# Patient Record
Sex: Female | Born: 1995 | Race: Black or African American | Hispanic: No | Marital: Single | Smoking: Never smoker
Health system: Southern US, Community
[De-identification: ages and names within clinical notes are randomized; demographics above are authoritative.]

## PROBLEM LIST (undated history)

## (undated) DIAGNOSIS — I456 Pre-excitation syndrome: Secondary | ICD-10-CM

## (undated) DIAGNOSIS — F419 Anxiety disorder, unspecified: Secondary | ICD-10-CM

---
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
## 2014-07-19 ENCOUNTER — Emergency Department (HOSPITAL_COMMUNITY)
Admission: EM | Admit: 2014-07-19 | Discharge: 2014-07-19 | Disposition: A | Discharge status: Home / Self Care | Payer: Medicaid Other | Attending: Emergency Medicine | Admitting: Emergency Medicine

## 2014-07-19 ENCOUNTER — Encounter (HOSPITAL_COMMUNITY): Payer: Self-pay | Admitting: Emergency Medicine

## 2014-07-19 DIAGNOSIS — IMO0001 Reserved for inherently not codable concepts without codable children: Secondary | ICD-10-CM | POA: Insufficient documentation

## 2014-07-19 DIAGNOSIS — J45909 Unspecified asthma, uncomplicated: Secondary | ICD-10-CM | POA: Diagnosis not present

## 2014-07-19 DIAGNOSIS — R42 Dizziness and giddiness: Secondary | ICD-10-CM | POA: Insufficient documentation

## 2014-07-19 DIAGNOSIS — R55 Syncope and collapse: Secondary | ICD-10-CM | POA: Insufficient documentation

## 2014-07-19 DIAGNOSIS — R404 Transient alteration of awareness: Secondary | ICD-10-CM | POA: Diagnosis present

## 2014-07-19 LAB — CBC WITH DIFFERENTIAL/PLATELET
Basophils Absolute: 0.1 10*3/uL (ref 0.0–0.1)
Basophils Relative: 1 % (ref 0–1)
Eosinophils Absolute: 0.1 10*3/uL (ref 0.0–0.7)
Eosinophils Relative: 2 % (ref 0–5)
HCT: 35.3 % — ABNORMAL LOW (ref 36.0–46.0)
Hemoglobin: 11.7 g/dL — ABNORMAL LOW (ref 12.0–15.0)
Lymphocytes Relative: 39 % (ref 12–46)
Lymphs Abs: 1.9 10*3/uL (ref 0.7–4.0)
MCH: 28.2 pg (ref 26.0–34.0)
MCHC: 33.1 g/dL (ref 30.0–36.0)
MCV: 85.1 fL (ref 78.0–100.0)
Monocytes Absolute: 0.5 10*3/uL (ref 0.1–1.0)
Monocytes Relative: 9 % (ref 3–12)
Neutro Abs: 2.4 10*3/uL (ref 1.7–7.7)
Neutrophils Relative %: 49 % (ref 43–77)
Platelets: 307 10*3/uL (ref 150–400)
RBC: 4.15 MIL/uL (ref 3.87–5.11)
RDW: 14 % (ref 11.5–15.5)
WBC: 4.9 10*3/uL (ref 4.0–10.5)

## 2014-07-19 LAB — BASIC METABOLIC PANEL
ANION GAP: 16 — AB (ref 5–15)
BUN: 6 mg/dL (ref 6–23)
CALCIUM: 8.9 mg/dL (ref 8.4–10.5)
CO2: 19 mEq/L (ref 19–32)
Chloride: 106 mEq/L (ref 96–112)
Creatinine, Ser: 0.92 mg/dL (ref 0.50–1.10)
GFR calc Af Amer: >90 mL/min (ref >90)
GFR, EST NON AFRICAN AMERICAN: 90 mL/min — AB (ref >90)
Glucose, Bld: 88 mg/dL (ref 70–99)
POTASSIUM: 6.2 meq/L — AB (ref 3.7–5.3)
SODIUM: 141 meq/L (ref 137–147)

## 2014-07-19 LAB — RAPID URINE DRUG SCREEN, HOSP PERFORMED
Amphetamines: NOT DETECTED
Barbiturates: NOT DETECTED
Benzodiazepines: NOT DETECTED
Cocaine: POSITIVE — AB
Opiates: NOT DETECTED
Tetrahydrocannabinol: NOT DETECTED

## 2014-07-19 LAB — I-STAT CHEM 8, ED
BUN: 4 mg/dL — ABNORMAL LOW (ref 6–23)
Calcium, Ion: 1.2 mmol/L (ref 1.12–1.23)
Chloride: 106 mEq/L (ref 96–112)
Creatinine, Ser: 1.1 mg/dL (ref 0.50–1.10)
Glucose, Bld: 94 mg/dL (ref 70–99)
HCT: 40 % (ref 36.0–46.0)
HEMOGLOBIN: 13.6 g/dL (ref 12.0–15.0)
Potassium: 3.5 mEq/L — ABNORMAL LOW (ref 3.7–5.3)
SODIUM: 145 meq/L (ref 137–147)
TCO2: 20 mmol/L (ref 0–100)

## 2014-07-19 LAB — ETHANOL: Alcohol, Ethyl (B): 167 mg/dL — ABNORMAL HIGH (ref 0–11)

## 2014-07-19 NOTE — ED Provider Notes (Signed)
CSN: [id number]     Arrival date & time 07/19/14  1756 History   First MD Initiated Contact with Patient 07/19/14 1802     Chief Complaint  Patient presents with  . Loss of Consciousness     (Consider location/radiation/quality/duration/timing/severity/associated sxs/prior Treatment) HPI Melanie Campbell is a 18 y.o. female who is currently on probation who is here for evaluation of "fainting". She reports a couple hours ago she was riding in the car to her family's house, she started to walk up the stairs began to feel "funny and light headed" at that point she is told she passed out by her family. Her family is not here to give a detailed account of the event. She is unsure of how long she was unconscious. She denies feeling confused and no headache now. She denies loss of bowel or bladder function, no trauma to her tongue. She does report that her whole body hurts she characterizes it as a stabbing pain. She denies any history of seizures, denies any pre-existing cardiac disease, she denies any illicit substance use. No fevers, shortness of breath, chest pain, dizziness, weakness, rashes. Past Medical History  Diagnosis Date  . Asthma   . Anxiety    History reviewed. No pertinent past surgical history. No family history on file. History  Substance Use Topics  . Smoking status: Never Smoker   . Smokeless tobacco: Not on file  . Alcohol Use: Yes   OB History   Grav Para Term Preterm Abortions TAB SAB Ect Mult Living                 Review of Systems  Constitutional: Positive for fever.  Respiratory: Negative for shortness of breath.   Cardiovascular: Negative for chest pain.  Endocrine: Negative for polyuria.  Genitourinary: Negative for dysuria.  Musculoskeletal: Positive for myalgias.  Neurological: Positive for light-headedness. Negative for weakness and headaches.  All other systems reviewed and are negative.     Allergies  Review of patient's allergies indicates no  known allergies.  Home Medications   Prior to Admission medications   Not on File   Pulse 77  Temp(Src) 98.2 F (36.8 C) (Oral)  Resp 18  SpO2 100%  LMP 06/18/2014 Physical Exam  Nursing note and vitals reviewed. Constitutional:  Awake, alert, nontoxic appearance with baseline speech for patient.  HENT:  Head: Normocephalic and atraumatic.  Mouth/Throat: Oropharynx is clear and moist. No oropharyngeal exudate.  Eyes: Conjunctivae and EOM are normal. Pupils are equal, round, and reactive to light. Right eye exhibits no discharge. Left eye exhibits no discharge.  Neck: Neck supple.  Cardiovascular: Normal rate and regular rhythm.   No murmur heard. Pulmonary/Chest: Effort normal and breath sounds normal. No stridor. No respiratory distress. She has no wheezes. She has no rales. She exhibits no tenderness.  Abdominal: Soft. Bowel sounds are normal. She exhibits no mass. There is no tenderness. There is no rebound.  Musculoskeletal: She exhibits no tenderness.  Baseline ROM, moves extremities with no obvious new focal weakness.  Lymphadenopathy:    She has no cervical adenopathy.  Neurological:  Awake, alert, cooperative and aware of situation; motor strength bilaterally; sensation normal to light touch bilaterally; peripheral visual fields full to confrontation; no facial asymmetry; tongue midline; major cranial nerves appear intact; no pronator drift, normal finger to nose bilaterally, baseline gait without new ataxia.  Skin: No rash noted.  Psychiatric: She has a normal mood and affect.    ED Course  Procedures (including  critical care time) Labs Review Labs Reviewed  URINE RAPID DRUG SCREEN (HOSP PERFORMED) - Abnormal; Notable for the following:    Cocaine POSITIVE (*)    All other components within normal limits  ETHANOL - Abnormal; Notable for the following:    Alcohol, Ethyl (B) 167 (*)    All other components within normal limits  CBC WITH DIFFERENTIAL - Abnormal;  Notable for the following:    Hemoglobin 11.7 (*)    HCT 35.3 (*)    All other components within normal limits  BASIC METABOLIC PANEL - Abnormal; Notable for the following:    Potassium 6.2 (*)    GFR calc non Af Amer 90 (*)    Anion gap 16 (*)    All other components within normal limits  I-STAT CHEM 8, ED - Abnormal; Notable for the following:    Potassium 3.5 (*)    BUN 4 (*)    All other components within normal limits    Imaging Review No results found.   EKG Interpretation   Date/Time:  Friday July 19 2014 18:08:36 EDT Ventricular Rate:  88 PR Interval:  161 QRS Duration: 83 QT Interval:  356 QTC Calculation: 431 R Axis:   82 Text Interpretation:  Normal sinus rhythm No significant change since last  tracing Confirmed by STEINL  MD, Caryn Bee ([id number]) on 07/19/2014 6:16:07 PM     Meds given in ED:  Medications - No data to display  New Prescriptions   No medications on file   Filed Vitals:   07/19/14 1757  Pulse: 77  Temp: 98.2 F (36.8 C)  TempSrc: Oral  Resp: 18  SpO2: 100%   Per nurse report, pt is "actively making out with partner on ED bed and does not appear to be in any distress" MDM  Vitals stable - WNL -afebrile Pt resting comfortably in ED.  PE unremarkable for any obvious pathology Labwork shows no sign of infection or emergent anemia UDS (+) cocaine.  Pt denies having taken it "someone must've put it in my drink"  EKG shows no evidence of arrythmia. Syncope likely vasovagal as evidenced by labwork and clinical picture.No evidence of seizure. No post seizure sequelae.  Discussed f/u with PCP and return precautions, pt very amenable to plan.  Final diagnoses:  Syncope, non cardiac  Prior to patient discharge, I discussed and reviewed this case with Dr.Steinl         Sharlene Motts, PA-C 07/19/14 2132

## 2014-07-19 NOTE — ED Notes (Addendum)
Pt reports it is Thursday; able to state month/year, self, place, and situation. Pt denies CP or SOB. Pt reports drank 4 shots of vodka.

## 2014-07-19 NOTE — Discharge Instructions (Signed)
Follow up with Primary care Provider as needed. Maintain appropriate hydration Avoid illicit substance use as this can lead to fainting events. If you begin to experience fevers, shortness of breath, chest pain, numbness or weakness or you pass out again, return to ED for further evaluation.   Emergency Department Resource Guide 1) Find a Doctor and Pay Out of Pocket Although you won't have to find out who is covered by your insurance plan, it is a good idea to ask around and get recommendations. You will then need to call the office and see if the doctor you have chosen will accept you as a new patient and what types of options they offer for patients who are self-pay. Some doctors offer discounts or will set up payment plans for their patients who do not have insurance, but you will need to ask so you aren't surprised when you get to your appointment.  2) Contact Your Local Health Department Not all health departments have doctors that can see patients for sick visits, but many do, so it is worth a call to see if yours does. If you don't know where your local health department is, you can check in your phone book. The CDC also has a tool to help you locate your state's health department, and many state websites also have listings of all of their local health departments.  3) Find a Walk-in Clinic If your illness is not likely to be very severe or complicated, you may want to try a walk in clinic. These are popping up all over the country in pharmacies, drugstores, and shopping centers. They're usually staffed by nurse practitioners or physician assistants that have been trained to treat common illnesses and complaints. They're usually fairly quick and inexpensive. However, if you have serious medical issues or chronic medical problems, these are probably not your best option.  No Primary Care Doctor: - Call Health Connect at  [phone] - they can help you locate a primary care doctor that  accepts  your insurance, provides certain services, etc. - Physician Referral Service- [phone]  Chronic Pain Problems: Organization         Address  Phone   Notes  [facility]  [phone] Patients need to be referred by their primary care doctor.   Medication Assistance: Organization         Address  Phone   Notes  [facility] Medication [facility] [address] [place] [place], [place] [address] [phone] --Must be a resident of [facility] -- Must have NO insurance coverage whatsoever (no Medicaid/ Medicare, etc.) -- The pt. MUST have a primary care doctor that directs their care regularly and follows them in the community   [facility]ist  [phone]   [employer]  [phone]    Agencies that provide inexpensive medical care: Organization         Address  Phone   Notes  [facility]  [phone]   [facility]    [phone]   [facility] [address] [place], [place] [address] [phone]   [facility] [address] [place] [place] [phone]   Planned Parenthood    [phone]   [facility]    [phone]   [facility]  [address], [place] Phone:  [phone], Fax:  [phone] Hours of Operation:  9 am - 6  pm, M-F.  Also accepts Medicaid/Medicare and self-pay.  [facility] for [place] [place], [address], [place] Phone: [phone], Fax: [phone]. Hours of Operation:  8:30 am - 5:30 pm, M-F.  Also accepts Medicaid and self-pay.  [facility] [address] [place] Phone: [phone]   [place], [place], [place] [phone], Ext. [phone] Mondays & Thursdays: 7-9 AM.  First 15 patients are seen on a first come, first serve basis.    [facility] Providers:  Organization          Address  Phone   Notes  [facility] [address], [place] [phone] Also accepts self-pay patients.  [facility] [address], [place]  [phone]   [place], [address], [place] [phone]   [facility] [address] [place] [phone]   Lucianne Lei [address], [place]   [phone] Only accepts [place] Access [place] patients after they have their name applied to their card.   Self-Pay (no insurance) in [facility]:  Organization         Address  Phone   Notes  Sickle Cell Patients, [facility] [place] [phone]   [facility] [place] [phone]   [facility] [place]  [place], [place], [place] [phone]   [facility]/Dr. Osei-Bonsu  [address] [place] or [address], [place] [phone] Phone number for both [place] and [place] locations is the same.  Urgent Medical and [facility] [address], [place] [phone]   [facility] [address] [place] or [address] [phone]   [facility] [address] [place] [phone], phone; [phone], fax Sees patients 1st and 3rd Saturday of every month.  Must not qualify for public or private insurance (i.e. Medicaid, Medicare, [place] Health Choice, Veterans' Benefits)  Household income should be no more than 200% of the poverty level The clinic cannot treat you if you are pregnant or think you are pregnant  Sexually transmitted diseases are not treated at the clinic.    Dental Care: Organization         Address  Phone  Notes  [facility] [place] [phone] Accepts children up to age 78 who are enrolled in [place] or [place]; pregnant women with a Medicaid card; and children who have applied for Medicaid or [place] Health Choice, but were declined, whose parents can pay a reduced fee at time of service.  [facility] Department of [facility]  [address], [place] [phone] Accepts children up to age 76 who are enrolled in [place] or [place]; pregnant women with a Medicaid card; and children who have applied for Medicaid or  Health Choice, but were declined, whose parents can pay a reduced fee at time of service.  [place] Adult Dental Access PROGRAM  [place] [phone] Patients are seen by appointment only. Walk-ins are not accepted. [place] will see patients 3 years of age and older.  Monday - Tuesday (8am-5pm) Most Wednesdays (8:30-5pm) $30 per visit, cash only  [facility] Adult Dental Access PROGRAM  [address], [facility] [phone] Patients are seen by appointment only. Walk-ins are not accepted. [place] will see patients 66 years of age and older. One Wednesday Evening (Monthly: Volunteer Based).  $30 per visit, cash only  [place]  [phone] for adults; Children under age 42, call Graduate Pediatric Dentistry at [phone]. Children aged 27-14, please call [phone] to request a pediatric application.  Dental services are provided in all areas of dental care including fillings, crowns and bridges, complete and partial dentures, implants, gum treatment, root canals, and extractions. Preventive care is also provided. Treatment is provided to both adults and children. Patients are selected via a lottery and there is often a waiting list.   [facility] [address], [place]  [phone] [email or web address]   [facility] [address] [place], [place]  [phone], Ext. [phone] Second and Fourth Thursday of each month, opens at 6:30 AM; Clinic ends at 9 AM.  Patients are seen on a first-come first-served basis, and a limited number are seen during each clinic.   [facility]  [address] Hillard Danker [place], [place] [phone]   Eligibility Requirements You must have lived in [place], [place], or [place] counties for at least the last three months.   You cannot be eligible for state or federal sponsored [employer], including [employer], [place], or [employer].   You generally cannot be eligible for healthcare insurance through your employer.    How to apply: Eligibility screenings are held every Tuesday and Wednesday afternoon from 1:00 pm until 4:00 pm. You do not need an appointment for the interview!  [facility] [address], [place], [place]   [place]  [place] Department  [place]  [phone]    Behavioral Health Resources in the Community: Intensive Outpatient Programs Organization         Address  Phone  Notes  [place]. [address], [place], [place] [phone]   [facility] Outpatient [address], [place], [place]   ADS: Alcohol & Drug Svcs [address], [place], [place]   [place] [address]  [place], [place] or [phone]   Substance Abuse Resources Organization         Address  Phone  Notes  Alcohol and Drug Services  [phone]   [place]  [phone]   [facility] [place]   Chinita Pester  [phone]   Residential & Outpatient Substance Abuse Program  [phone]   Psychological Services Organization         Address  Phone  Notes  [facility]  [place]  [phone]    [place] [address] [place] or [phone]    Mobile Crisis Teams Organization         Address  Phone  Notes  [facility], Mobile Crisis Care Unit  [phone]   [facility]otherapeutic Services  [address] [place], [place]   [place] [address] [place] [phone]    Self-Help/Support Groups Organization         Address  Phone  Notes  [facility] [place] - variety of support groups  [place] Call for more information  Narcotics Anonymous (NA), [facility] [address], [employer] [place]  2 meetings at this location   Special educational needs teacher         Address  Phone  Notes  ASAP Residential Treatment [place],    [place]  [phone]   [facility]  [address], [place] [phone], [place], [place]   [place], [place] [phone] Admissions: 8am-3pm M-F  Incentives Substance [place] [address],    [place], [place] [phone]   [facility] [address] [place], [place], [place]   [facility] [address]  [place], [place]   [facility] Programs - Intensive Outpatient [address], [place], [place]   [facility] ([place] [place].,  [place], [place] [phone] or [phone]   [facility] Services (RTS) [address] [place], [place] Accepts Medicaid  [facility] [address],  [place] [phone] Substance Abuse/Addiction Treatment   [facility] Organization         Address  Phone  Notes  [facility]  [phone]   Domenic Schwab, PhD [address] Arlis Porta [place], [place]   [phone] or [phone]   [place]  [place], [place] [phone]   [facility] [address], [place], [place] [phone] Insurance/Medicaid/sponsorship through [facility] and Families [address], [address] [place]                                    [place], [place] [phone] [place] [address], [place] [phone]    Dr. Adele Schilder  [phone]   [facility] Dept. 1) [address] [place] 2) [place] 3)  [place] [address], [place] [phone]  [phone]   [place] [phone] or [phone] (After Hours)

## 2014-07-19 NOTE — ED Notes (Addendum)
Per EMS pt ETOH with friends. Pt felt heart racing and walked inside. On way inside LOC. Pt complaint of generalized pain but denies head injury.

## 2014-07-19 NOTE — ED Notes (Signed)
Pt is alert and oriented,  Upon entering room pt and her girlfriend are in bed together,  Kissing and hugging, this writer advised pt that "making out " is not allowed in hospital bed and the "friend" needs to sit in chair beside bed,  Pt is in NAD  .  Sharlet Salina NP aware

## 2014-07-20 NOTE — ED Provider Notes (Signed)
Medical screening examination/treatment/procedure(s) were conducted as a shared visit with non-physician practitioner(s) and myself.  I personally evaluated the patient during the encounter.   EKG Interpretation   Date/Time:  Friday July 19 2014 18:08:36 EDT Ventricular Rate:  88 PR Interval:  161 QRS Duration: 83 QT Interval:  356 QTC Calculation: 431 R Axis:   82 Text Interpretation:  Normal sinus rhythm No significant change since last  tracing Confirmed by Dan Dissinger  MD, Caryn Bee ([id number]) on 07/19/2014 6:16:07 PM      Pt indicates she felt 'funny', and achy all over after drinking alcohol and using cocaine. Denies daily use. No chest pain. No sob. No headache. No numbness/weakness. Currently feels improved. No focal pain or tenderness on exam.   Suzi Roots, MD 07/20/14 [phone]

## 2014-10-15 ENCOUNTER — Emergency Department (HOSPITAL_COMMUNITY)
Admission: EM | Admit: 2014-10-15 | Discharge: 2014-10-15 | Disposition: A | Discharge status: Home / Self Care | Payer: Medicaid Other | Attending: Emergency Medicine | Admitting: Emergency Medicine

## 2014-10-15 ENCOUNTER — Encounter (HOSPITAL_COMMUNITY): Payer: Self-pay | Admitting: Emergency Medicine

## 2014-10-15 DIAGNOSIS — R062 Wheezing: Secondary | ICD-10-CM

## 2014-10-15 DIAGNOSIS — J45901 Unspecified asthma with (acute) exacerbation: Secondary | ICD-10-CM | POA: Diagnosis not present

## 2014-10-15 DIAGNOSIS — Z8659 Personal history of other mental and behavioral disorders: Secondary | ICD-10-CM | POA: Insufficient documentation

## 2014-10-15 MED ORDER — PREDNISONE 20 MG PO TABS: 40.0000 mg | ORAL_TABLET | Freq: Every day | ORAL | Status: DC

## 2014-10-15 MED ORDER — ALBUTEROL SULFATE HFA 108 (90 BASE) MCG/ACT IN AERS: 1.0000 | INHALATION_SPRAY | Freq: Four times a day (QID) | RESPIRATORY_TRACT | Status: DC | PRN

## 2014-10-15 MED ORDER — ALBUTEROL SULFATE (2.5 MG/3ML) 0.083% IN NEBU: 2.5000 mg | INHALATION_SOLUTION | Freq: Once | RESPIRATORY_TRACT | Status: DC

## 2014-10-15 MED ORDER — ALBUTEROL SULFATE (2.5 MG/3ML) 0.083% IN NEBU
5.0000 mg | INHALATION_SOLUTION | Freq: Once | RESPIRATORY_TRACT | Status: AC
  Administered 2014-10-15: 5 mg via RESPIRATORY_TRACT
  Filled 2014-10-15: qty 6

## 2014-10-15 MED ORDER — ALBUTEROL SULFATE HFA 108 (90 BASE) MCG/ACT IN AERS
2.0000 | INHALATION_SPRAY | Freq: Once | RESPIRATORY_TRACT | Status: AC
  Administered 2014-10-15: 2 via RESPIRATORY_TRACT
  Filled 2014-10-15: qty 6.7

## 2014-10-15 MED ORDER — PREDNISONE 20 MG PO TABS
60.0000 mg | ORAL_TABLET | Freq: Once | ORAL | Status: AC
  Administered 2014-10-15: 60 mg via ORAL
  Filled 2014-10-15: qty 3

## 2014-10-15 NOTE — ED Provider Notes (Signed)
CSN: [id number]     Arrival date & time 10/15/14  1334 History  This chart was scribed for non-physician practitioner Emilia BeckKaitlyn Glenola Wheat, PA-C, working with Linwood DibblesJon Knapp, MD by Littie Deedsichard Sun, ED Scribe. This patient was seen in room TR05C/TR05C and the patient's care was started at 2:49 PM.    Chief Complaint  Patient presents with  . Asthma    Patient is a 18 y.o. female presenting with asthma. The history is provided by the patient. No language interpreter was used.  Asthma This is a chronic problem. The current episode started more than 2 days ago. The problem occurs constantly. The problem has not changed since onset.Nothing aggravates the symptoms. She has tried nothing for the symptoms.   HPI Comments: Melanie Campbell is a 18 y.o. female with a hx of asthma who presents to the Emergency Department complaining of exacerbation of her asthma including wheezing that began 1 week ago. Patient states that she ran out of her inhaler last week. She denies fever.   Past Medical History  Diagnosis Date  . Asthma   . Anxiety    History reviewed. No pertinent past surgical history. No family history on file. History  Substance Use Topics  . Smoking status: Never Smoker   . Smokeless tobacco: Not on file  . Alcohol Use: Yes   OB History    No data available     Review of Systems  Constitutional: Negative for fever.  Respiratory: Positive for wheezing.   All other systems reviewed and are negative.     Allergies  Review of patient's allergies indicates no known allergies.  Home Medications   Prior to Admission medications   Not on File   BP 114/64 mmHg  Pulse 82  Temp(Src) 98.1 F (36.7 C) (Oral)  Resp 18  Ht 5\' 2"  (1.575 m)  Wt 150 lb (68.04 kg)  BMI 27.43 kg/m2  SpO2 98%  LMP 09/23/2014 Physical Exam  Constitutional: She is oriented to person, place, and time. She appears well-developed and well-nourished. No distress.  HENT:  Head: Normocephalic and atraumatic.   Mouth/Throat: Oropharynx is clear and moist. No oropharyngeal exudate.  Eyes: Pupils are equal, round, and reactive to light.  Neck: Neck supple.  Cardiovascular: Normal rate.   Pulmonary/Chest: Effort normal. No respiratory distress. She has wheezes. She has no rales.  Wheezing noted throughout bilateral lungs.   Abdominal: Soft. She exhibits no distension.  Musculoskeletal: She exhibits no edema.  Neurological: She is alert and oriented to person, place, and time. No cranial nerve deficit.  Skin: Skin is warm and dry. No rash noted.  Psychiatric: She has a normal mood and affect. Her behavior is normal.  Nursing note and vitals reviewed.   ED Course  Procedures  DIAGNOSTIC STUDIES: Oxygen Saturation is 98% on room air, normal by my interpretation.    COORDINATION OF CARE: 2:51 PM-Discussed treatment plan which includes breathing treatment, inhaler and imaging with pt at bedside and pt agreed to plan.    Labs Review Labs Reviewed - No data to display  Imaging Review No results found.   EKG Interpretation None      MDM   Final diagnoses:  Asthma exacerbation    Patient's wheezing improved with nebulizer treatment. Vitals stable and patient afebrile. Patient will have prednisone and albuterol inhaler. Patient instructed to return with worsening or concerning symptoms.   I personally performed the services described in this documentation, which was scribed in my presence. The recorded information has  been reviewed and is accurate.    Emilia BeckKaitlyn Siddhi Dornbush, PA-C 10/16/14 1009  Linwood DibblesJon Knapp, MD 10/16/14 [phone]

## 2014-10-15 NOTE — ED Notes (Signed)
Patient states she started having "asthma problems" last week.  Patient states she ran out of her inhaler last week.  Patient called ambulance last week, but refused to be transported because "I didn't have a way back home".   Patient talking in full sentences in triage.   No distress noted.

## 2014-10-15 NOTE — Discharge Instructions (Signed)
Take prednisone as directed until gone. Use albuterol as needed. Refer to attached documents for more information.

## 2014-12-06 ENCOUNTER — Emergency Department (HOSPITAL_COMMUNITY)
Admission: EM | Admit: 2014-12-06 | Discharge: 2014-12-06 | Disposition: A | Discharge status: Home / Self Care | Payer: Medicaid Other | Attending: Emergency Medicine | Admitting: Emergency Medicine

## 2014-12-06 ENCOUNTER — Encounter (HOSPITAL_COMMUNITY): Payer: Self-pay | Admitting: Physical Medicine and Rehabilitation

## 2014-12-06 DIAGNOSIS — J45909 Unspecified asthma, uncomplicated: Secondary | ICD-10-CM | POA: Diagnosis present

## 2014-12-06 DIAGNOSIS — J45901 Unspecified asthma with (acute) exacerbation: Secondary | ICD-10-CM

## 2014-12-06 DIAGNOSIS — R319 Hematuria, unspecified: Secondary | ICD-10-CM | POA: Insufficient documentation

## 2014-12-06 DIAGNOSIS — Z7952 Long term (current) use of systemic steroids: Secondary | ICD-10-CM | POA: Insufficient documentation

## 2014-12-06 DIAGNOSIS — Z8659 Personal history of other mental and behavioral disorders: Secondary | ICD-10-CM | POA: Diagnosis not present

## 2014-12-06 DIAGNOSIS — Z3202 Encounter for pregnancy test, result negative: Secondary | ICD-10-CM | POA: Insufficient documentation

## 2014-12-06 DIAGNOSIS — Z79899 Other long term (current) drug therapy: Secondary | ICD-10-CM | POA: Insufficient documentation

## 2014-12-06 DIAGNOSIS — R300 Dysuria: Secondary | ICD-10-CM | POA: Diagnosis not present

## 2014-12-06 LAB — URINALYSIS, ROUTINE W REFLEX MICROSCOPIC
Bilirubin Urine: NEGATIVE
GLUCOSE, UA: NEGATIVE mg/dL
Hgb urine dipstick: NEGATIVE
Ketones, ur: NEGATIVE mg/dL
LEUKOCYTES UA: NEGATIVE
Nitrite: NEGATIVE
Protein, ur: NEGATIVE mg/dL
Specific Gravity, Urine: 1.016 (ref 1.005–1.030)
UROBILINOGEN UA: 0.2 mg/dL (ref 0.0–1.0)
pH: 6.5 (ref 5.0–8.0)

## 2014-12-06 LAB — PREGNANCY, URINE: Preg Test, Ur: NEGATIVE

## 2014-12-06 MED ORDER — ALBUTEROL SULFATE HFA 108 (90 BASE) MCG/ACT IN AERS: 2.0000 | INHALATION_SPRAY | RESPIRATORY_TRACT | Status: DC | PRN

## 2014-12-06 MED ORDER — ALBUTEROL SULFATE HFA 108 (90 BASE) MCG/ACT IN AERS
2.0000 | INHALATION_SPRAY | RESPIRATORY_TRACT | Status: DC | PRN
  Administered 2014-12-06: 2 via RESPIRATORY_TRACT
  Filled 2014-12-06: qty 6.7

## 2014-12-06 NOTE — ED Notes (Signed)
Pt presents to department for evaluation of asthma exacerbation, states she ran out of inhaler at home, now reports difficulty catching breath. Also states hematuria and dysuria x2 days. Pt is alert and oriented x4. Respirations unlabored. Speaking complete sentences.

## 2014-12-06 NOTE — ED Provider Notes (Signed)
CSN: [id number]     Arrival date & time 12/06/14  [id number] History   First MD Initiated Contact with Patient 12/06/14 [phone]     Chief Complaint  Patient presents with  . Asthma  . Dysuria  . Hematuria      HPI Pt presents to department for evaluation of asthma exacerbation, states she ran out of inhaler at home, now reports difficulty catching breath. Also states hematuria and dysuria x2 days.  Patient ran out of her inhaler 2 days ago. Past Medical History  Diagnosis Date  . Asthma   . Anxiety    History reviewed. No pertinent past surgical history. No family history on file. History  Substance Use Topics  . Smoking status: Never Smoker   . Smokeless tobacco: Not on file  . Alcohol Use: Yes   OB History    No data available     Review of Systems  All other systems reviewed and are negative  Allergies  Review of patient's allergies indicates no known allergies.  Home Medications   Prior to Admission medications   Medication Sig Start Date End Date Taking? Authorizing Provider  albuterol (PROVENTIL HFA;VENTOLIN HFA) 108 (90 BASE) MCG/ACT inhaler Inhale 1-2 puffs into the lungs every 6 (six) hours as needed for wheezing or shortness of breath. 10/15/14   Kaitlyn Szekalski, PA-C  albuterol (PROVENTIL HFA;VENTOLIN HFA) 108 (90 BASE) MCG/ACT inhaler Inhale 2 puffs into the lungs every 4 (four) hours as needed for wheezing or shortness of breath. 12/06/14   Nelia Shiobert L Neylan Koroma, MD  predniSONE (DELTASONE) 20 MG tablet Take 2 tablets (40 mg total) by mouth daily. 10/15/14   Kaitlyn Szekalski, PA-C   BP 98/59 mmHg  Pulse 59  Temp(Src) 98.2 F (36.8 C) (Oral)  Resp 18  Ht 5\' 2"  (1.575 m)  Wt 160 lb (72.576 kg)  BMI 29.26 kg/m2  SpO2 98% Physical Exam Physical Exam  Nursing note and vitals reviewed. Constitutional: She is oriented to person, place, and time. She appears well-developed and well-nourished. No distress.  HENT:  Head: Normocephalic and atraumatic.  Eyes: Pupils are  equal, round, and reactive to light.  Neck: Normal range of motion.  Cardiovascular: Normal rate and intact distal pulses.   Pulmonary/Chest: No respiratory distress.  Abdominal: Normal appearance. She exhibits no distension.  Musculoskeletal: Normal range of motion.  Neurological: She is alert and oriented to person, place, and time. No cranial nerve deficit.  Skin: Skin is warm and dry. No rash noted.  Psychiatric: She has a normal mood and affect. Her behavior is normal.   ED Course  Procedures (including critical care time) Medications  albuterol (PROVENTIL HFA;VENTOLIN HFA) 108 (90 BASE) MCG/ACT inhaler 2 puff (not administered)   Labs Review Labs Reviewed  URINALYSIS, ROUTINE W REFLEX MICROSCOPIC  PREGNANCY, URINE     EKG Interpretation   Date/Time:  Friday December 06 2014 08:45:49 EST Ventricular Rate:  77 PR Interval:  124 QRS Duration: 102 QT Interval:  384 QTC Calculation: 434 R Axis:   59 Text Interpretation:  Normal sinus rhythm No significant change since last  tracing Confirmed by Molly Savarino  MD, Araiya Tilmon ([id number]) on 12/06/2014 10:58:29 AM      MDM   Final diagnoses:  Asthma exacerbation        Nelia Shiobert L Darneisha Windhorst, MD 12/06/14 1101

## 2014-12-06 NOTE — Discharge Instructions (Signed)

## 2015-04-07 ENCOUNTER — Emergency Department (HOSPITAL_COMMUNITY)
Admission: EM | Admit: 2015-04-07 | Discharge: 2015-04-07 | Disposition: A | Discharge status: Home / Self Care | Payer: Medicaid Other | Attending: Emergency Medicine | Admitting: Emergency Medicine

## 2015-04-07 ENCOUNTER — Encounter (HOSPITAL_COMMUNITY): Payer: Self-pay | Admitting: Emergency Medicine

## 2015-04-07 DIAGNOSIS — Z7952 Long term (current) use of systemic steroids: Secondary | ICD-10-CM | POA: Insufficient documentation

## 2015-04-07 DIAGNOSIS — Z8659 Personal history of other mental and behavioral disorders: Secondary | ICD-10-CM | POA: Insufficient documentation

## 2015-04-07 DIAGNOSIS — J45909 Unspecified asthma, uncomplicated: Secondary | ICD-10-CM | POA: Diagnosis present

## 2015-04-07 DIAGNOSIS — J45901 Unspecified asthma with (acute) exacerbation: Secondary | ICD-10-CM | POA: Diagnosis not present

## 2015-04-07 DIAGNOSIS — Z79899 Other long term (current) drug therapy: Secondary | ICD-10-CM | POA: Insufficient documentation

## 2015-04-07 MED ORDER — PREDNISONE 20 MG PO TABS
40.0000 mg | ORAL_TABLET | Freq: Once | ORAL | Status: AC
  Administered 2015-04-07: 40 mg via ORAL
  Filled 2015-04-07: qty 2

## 2015-04-07 MED ORDER — ALBUTEROL SULFATE HFA 108 (90 BASE) MCG/ACT IN AERS
2.0000 | INHALATION_SPRAY | Freq: Once | RESPIRATORY_TRACT | Status: AC
  Administered 2015-04-07: 2 via RESPIRATORY_TRACT
  Filled 2015-04-07: qty 6.7

## 2015-04-07 MED ORDER — ALBUTEROL SULFATE HFA 108 (90 BASE) MCG/ACT IN AERS: 1.0000 | INHALATION_SPRAY | Freq: Four times a day (QID) | RESPIRATORY_TRACT | Status: DC | PRN

## 2015-04-07 MED ORDER — PREDNISONE 20 MG PO TABS: 40.0000 mg | ORAL_TABLET | Freq: Every day | ORAL | Status: DC

## 2015-04-07 MED ORDER — IPRATROPIUM-ALBUTEROL 0.5-2.5 (3) MG/3ML IN SOLN
3.0000 mL | Freq: Once | RESPIRATORY_TRACT | Status: AC
  Administered 2015-04-07: 3 mL via RESPIRATORY_TRACT
  Filled 2015-04-07: qty 3

## 2015-04-07 NOTE — Discharge Instructions (Signed)
Please follow up with your primary care physician in 1-2 days. If you do not have one please call the [facility] number listed above. Please read all discharge instructions and return precautions.  ° ° °Asthma °Asthma is a recurring condition in which the airways tighten and narrow. Asthma can make it difficult to breathe. It can cause coughing, wheezing, and shortness of breath. Asthma episodes, also called asthma attacks, range from minor to life-threatening. Asthma cannot be cured, but medicines and lifestyle changes can help control it. °CAUSES °Asthma is believed to be caused by inherited (genetic) and environmental factors, but its exact cause is unknown. Asthma may be triggered by allergens, lung infections, or irritants in the air. Asthma triggers are different for each person. Common triggers include:  °· Animal dander. °· Dust mites. °· Cockroaches. °· Pollen from trees or grass. °· Mold. °· Smoke. °· Air pollutants such as dust, household cleaners, hair sprays, aerosol sprays, paint fumes, strong chemicals, or strong odors. °· Cold air, weather changes, and winds (which increase molds and pollens in the air). °· Strong emotional expressions such as crying or laughing hard. °· Stress. °· Certain medicines (such as aspirin) or types of drugs (such as beta-blockers). °· Sulfites in foods and drinks. Foods and drinks that may contain sulfites include dried fruit, potato chips, and sparkling grape juice. °· Infections or inflammatory conditions such as the flu, a cold, or an inflammation of the nasal membranes (rhinitis). °· Gastroesophageal reflux disease (GERD). °· Exercise or strenuous activity. °SYMPTOMS °Symptoms may occur immediately after asthma is triggered or many hours later. Symptoms include: °· Wheezing. °· Excessive nighttime or early morning coughing. °· Frequent or severe coughing with a common cold. °· Chest tightness. °· Shortness of breath. °DIAGNOSIS  °The diagnosis of  asthma is made by a review of your medical history and a physical exam. Tests may also be performed. These may include: °· Lung function studies. These tests show how much air you breathe in and out. °· Allergy tests. °· Imaging tests such as X-rays. °TREATMENT  °Asthma cannot be cured, but it can usually be controlled. Treatment involves identifying and avoiding your asthma triggers. It also involves medicines. There are 2 classes of medicine used for asthma treatment:  °· Controller medicines. These prevent asthma symptoms from occurring. They are usually taken every day. °· Reliever or rescue medicines. These quickly relieve asthma symptoms. They are used as needed and provide short-term relief. °Your health care provider will help you create an asthma action plan. An asthma action plan is a written plan for managing and treating your asthma attacks. It includes a list of your asthma triggers and how they may be avoided. It also includes information on when medicines should be taken and when their dosage should be changed. An action plan may also involve the use of a device called a peak flow meter. A peak flow meter measures how well the lungs are working. It helps you monitor your condition. °HOME CARE INSTRUCTIONS  °· Take medicines only as directed by your health care provider. Speak with your health care provider if you have questions about how or when to take the medicines. °· Use a peak flow meter as directed by your health care provider. Record and keep track of readings. °· Understand and use the action plan to help minimize or stop an asthma attack without needing to seek medical care. °· Control your home environment in the following ways to help prevent   asthma attacks: °¨ Do not smoke. Avoid being exposed to secondhand smoke. °¨ Change your heating and air conditioning filter regularly. °¨ Limit your use of fireplaces and wood stoves. °¨ Get rid of pests (such as roaches and mice) and their  droppings. °¨ Throw away plants if you see mold on them. °¨ Clean your floors and dust regularly. Use unscented cleaning products. °¨ Try to have someone else vacuum for you regularly. Stay out of rooms while they are being vacuumed and for a short while afterward. If you vacuum, use a dust mask from a hardware store, a double-layered or microfilter vacuum cleaner bag, or a vacuum cleaner with a HEPA filter. °¨ Replace carpet with wood, tile, or vinyl flooring. Carpet can trap dander and dust. °¨ Use allergy-proof pillows, mattress covers, and box spring covers. °¨ Wash bed sheets and blankets every week in hot water and dry them in a dryer. °¨ Use blankets that are made of polyester or cotton. °¨ Clean bathrooms and kitchens with bleach. If possible, have someone repaint the walls in these rooms with mold-resistant paint. Keep out of the rooms that are being cleaned and painted. °¨ Wash hands frequently. °SEEK MEDICAL CARE IF:  °· You have wheezing, shortness of breath, or a cough even if taking medicine to prevent attacks. °· The colored mucus you cough up (sputum) is thicker than usual. °· Your sputum changes from clear or white to yellow, green, gray, or bloody. °· You have any problems that may be related to the medicines you are taking (such as a rash, itching, swelling, or trouble breathing). °· You are using a reliever medicine more than 2-3 times per week. °· Your peak flow is still at 50-79% of your personal best after following your action plan for 1 hour. °· You have a fever. °SEEK IMMEDIATE MEDICAL CARE IF:  °· You seem to be getting worse and are unresponsive to treatment during an asthma attack. °· You are short of breath even at rest. °· You get short of breath when doing very little physical activity. °· You have difficulty eating, drinking, or talking due to asthma symptoms. °· You develop chest pain. °· You develop a fast heartbeat. °· You have a bluish color to your lips or fingernails. °· You  are light-headed, dizzy, or faint. °· Your peak flow is less than 50% of your personal best. °MAKE SURE YOU:  °· Understand these instructions. °· Will watch your condition. °· Will get help right away if you are not doing well or get worse. °Document Released: 11/08/2005 Document Revised: 03/25/2014 Document Reviewed: 06/07/2013 °[employer]® Patient Information ©2015 [employer]. This information is not intended to replace advice given to you by your health care provider. Make sure you discuss any questions you have with your health care provider. ° °

## 2015-04-07 NOTE — ED Notes (Signed)
Pt from home for eval of worsening asthma x1 week, pt states out of inhaler and breathing treatments at home. Denies any chest pain at this time, nad noted. Pt ambulatory at triage.

## 2015-04-07 NOTE — ED Provider Notes (Signed)
CSN: [id number]     Arrival date & time 04/07/15  1149 History  This chart was scribed for non-physician practitioner, Francee PiccoloJennifer Teshawn Moan, working with Doug SouSam Jacubowitz, MD by Richarda Overlieichard Holland, ED Scribe. This patient was seen in room TR05C/TR05C and the patient's care was started at 1:15 PM.    Chief Complaint  Patient presents with  . Asthma   The history is provided by the patient. No language interpreter was used.   HPI Comments: Melanie Campbell is a 19 y.o. female with a past hx of asthma who presents to the Emergency Department complaining of an asthma flare up that started 3 days ago. She states that her symptoms started with chest tightness which she complains of currently. Pt states that she is out of her inhaler and breathing treatments at home. Pt reports she has had to stay in the hospital for an asthma flare up in the past but not recently. Pt reports no alleviating or exacerbating factors at this time. She denies fever, vomiting and diarrhea    Past Medical History  Diagnosis Date  . Asthma   . Anxiety    History reviewed. No pertinent past surgical history. No family history on file. History  Substance Use Topics  . Smoking status: Never Smoker   . Smokeless tobacco: Not on file  . Alcohol Use: Yes   OB History    No data available     Review of Systems  Constitutional: Negative for fever.  Respiratory: Positive for chest tightness.   Cardiovascular: Negative for chest pain.  Gastrointestinal: Negative for vomiting and diarrhea.   Allergies  Review of patient's allergies indicates no known allergies.  Home Medications   Prior to Admission medications   Medication Sig Start Date End Date Taking? Authorizing Provider  albuterol (PROVENTIL HFA;VENTOLIN HFA) 108 (90 BASE) MCG/ACT inhaler Inhale 1-2 puffs into the lungs every 6 (six) hours as needed for wheezing or shortness of breath. 10/15/14   Kaitlyn Szekalski, PA-C  albuterol (PROVENTIL HFA;VENTOLIN HFA) 108  (90 BASE) MCG/ACT inhaler Inhale 2 puffs into the lungs every 4 (four) hours as needed for wheezing or shortness of breath. 12/06/14   Nelva Nayobert Beaton, MD  albuterol (PROVENTIL HFA;VENTOLIN HFA) 108 (90 BASE) MCG/ACT inhaler Inhale 1-2 puffs into the lungs every 6 (six) hours as needed for wheezing or shortness of breath. 04/07/15   Reka Wist, PA-C  predniSONE (DELTASONE) 20 MG tablet Take 2 tablets (40 mg total) by mouth daily. 10/15/14   Kaitlyn Szekalski, PA-C  predniSONE (DELTASONE) 20 MG tablet Take 2 tablets (40 mg total) by mouth daily. 04/07/15   Hosea Hanawalt, PA-C   BP 109/59 mmHg  Pulse 77  Temp(Src) 97.9 F (36.6 C) (Oral)  Resp 16  SpO2 100% Physical Exam  Constitutional: She is oriented to person, place, and time. She appears well-developed and well-nourished. No distress.  HENT:  Head: Normocephalic and atraumatic.  Right Ear: External ear normal.  Left Ear: External ear normal.  Nose: Nose normal.  Mouth/Throat: Oropharynx is clear and moist. No oropharyngeal exudate.  Eyes: Conjunctivae are normal.  Neck: Normal range of motion. Neck supple.  No nuchal rigidity.   Cardiovascular: Normal rate, regular rhythm and normal heart sounds.   No murmur heard. Pulmonary/Chest: Effort normal. No respiratory distress. She has wheezes.  Decreased breath sounds with scant expiratory wheeze.   Abdominal: Soft.  Musculoskeletal: Normal range of motion.  Neurological: She is alert and oriented to person, place, and time.  Skin: Skin is warm  and dry. She is not diaphoretic.  Psychiatric: She has a normal mood and affect.  Nursing note and vitals reviewed.   ED Course  Procedures   Medications  ipratropium-albuterol (DUONEB) 0.5-2.5 (3) MG/3ML nebulizer solution 3 mL (3 mLs Nebulization Given 04/07/15 1328)  predniSONE (DELTASONE) tablet 40 mg (40 mg Oral Given 04/07/15 1328)  albuterol (PROVENTIL HFA;VENTOLIN HFA) 108 (90 BASE) MCG/ACT inhaler 2 puff (2 puffs  Inhalation Given 04/07/15 1419)    DIAGNOSTIC STUDIES: Oxygen Saturation is 100% on RA, normal by my interpretation.    COORDINATION OF CARE: 1:16 PM Discussed treatment plan with pt at bedside and pt agreed to plan.   Labs Review Labs Reviewed - No data to display  Imaging Review No results found.   EKG Interpretation None      2:03 PM On re-evaluation lungs clear to auscultation. Patient states she is breathing back at baseline and is ready for discharge.  MDM   Final diagnoses:  Asthma exacerbation   Filed Vitals:   04/07/15 1420  BP: 109/59  Pulse: 77  Temp: 97.9 F (36.6 C)  Resp: 16   Afebrile, NAD, non-toxic appearing, AAOx4.  Patient presenting to the ED with asthma exacerbation. Pt alert, active, and oriented per age. PE showed diminished breath sounds at bases with scant expiratory wheezing no respiratory distress, accessory muscle use or retractions noted. No hypoxia. DuoNeb and prednisone given in the ED with improvement. Wheezing improved after nebulizer treatment administration. Oxygen saturations maintained above 92% in the ED. No evidence of respiratory distress, hypoxia, retractions, or accessory muscle use on re-evaluation. No indication for admission at this time. Will discharge patient home with prednisone 5 days and inhaler. Return precautions discussed. Patient agreeable to plan. Patient is stable at time of discharge    I personally performed the services described in this documentation, which was scribed in my presence. The recorded information has been reviewed and is accurate.       Francee PiccoloJennifer Marijke Guadiana, PA-C 04/07/15 1438  Doug SouSam Jacubowitz, MD 04/07/15 1747

## 2015-05-14 ENCOUNTER — Emergency Department (HOSPITAL_COMMUNITY): Payer: Medicaid Other

## 2015-05-14 ENCOUNTER — Emergency Department (HOSPITAL_COMMUNITY)
Admission: EM | Admit: 2015-05-14 | Discharge: 2015-05-14 | Disposition: A | Discharge status: Home / Self Care | Payer: Medicaid Other | Attending: Emergency Medicine | Admitting: Emergency Medicine

## 2015-05-14 ENCOUNTER — Encounter (HOSPITAL_COMMUNITY): Payer: Self-pay | Admitting: *Deleted

## 2015-05-14 DIAGNOSIS — J029 Acute pharyngitis, unspecified: Secondary | ICD-10-CM | POA: Insufficient documentation

## 2015-05-14 DIAGNOSIS — N83209 Unspecified ovarian cyst, unspecified side: Secondary | ICD-10-CM

## 2015-05-14 DIAGNOSIS — R1030 Lower abdominal pain, unspecified: Secondary | ICD-10-CM

## 2015-05-14 DIAGNOSIS — R51 Headache: Secondary | ICD-10-CM | POA: Insufficient documentation

## 2015-05-14 DIAGNOSIS — N8329 Other ovarian cysts: Secondary | ICD-10-CM | POA: Insufficient documentation

## 2015-05-14 DIAGNOSIS — Z8659 Personal history of other mental and behavioral disorders: Secondary | ICD-10-CM | POA: Insufficient documentation

## 2015-05-14 DIAGNOSIS — Z7952 Long term (current) use of systemic steroids: Secondary | ICD-10-CM | POA: Insufficient documentation

## 2015-05-14 DIAGNOSIS — R509 Fever, unspecified: Secondary | ICD-10-CM | POA: Insufficient documentation

## 2015-05-14 DIAGNOSIS — R197 Diarrhea, unspecified: Secondary | ICD-10-CM | POA: Insufficient documentation

## 2015-05-14 DIAGNOSIS — Z3202 Encounter for pregnancy test, result negative: Secondary | ICD-10-CM | POA: Insufficient documentation

## 2015-05-14 DIAGNOSIS — J45909 Unspecified asthma, uncomplicated: Secondary | ICD-10-CM | POA: Insufficient documentation

## 2015-05-14 LAB — WET PREP, GENITAL
Trich, Wet Prep: NONE SEEN
Yeast Wet Prep HPF POC: NONE SEEN

## 2015-05-14 LAB — URINALYSIS, ROUTINE W REFLEX MICROSCOPIC
Bilirubin Urine: NEGATIVE
Glucose, UA: NEGATIVE mg/dL
Hgb urine dipstick: NEGATIVE
Ketones, ur: NEGATIVE mg/dL
Leukocytes, UA: NEGATIVE
Nitrite: NEGATIVE
Protein, ur: NEGATIVE mg/dL
Specific Gravity, Urine: 1.025 (ref 1.005–1.030)
Urobilinogen, UA: 1 mg/dL (ref 0.0–1.0)
pH: 6.5 (ref 5.0–8.0)

## 2015-05-14 LAB — CBC WITH DIFFERENTIAL/PLATELET
Basophils Absolute: 0 10*3/uL (ref 0.0–0.1)
Basophils Relative: 0 % (ref 0–1)
Eosinophils Absolute: 0.1 10*3/uL (ref 0.0–0.7)
Eosinophils Relative: 2 % (ref 0–5)
HCT: 37.8 % (ref 36.0–46.0)
Hemoglobin: 12.6 g/dL (ref 12.0–15.0)
Lymphocytes Relative: 21 % (ref 12–46)
Lymphs Abs: 1 10*3/uL (ref 0.7–4.0)
MCH: 27.8 pg (ref 26.0–34.0)
MCHC: 33.3 g/dL (ref 30.0–36.0)
MCV: 83.3 fL (ref 78.0–100.0)
Monocytes Absolute: 0.4 10*3/uL (ref 0.1–1.0)
Monocytes Relative: 9 % (ref 3–12)
Neutro Abs: 3 10*3/uL (ref 1.7–7.7)
Neutrophils Relative %: 68 % (ref 43–77)
Platelets: 252 10*3/uL (ref 150–400)
RBC: 4.54 MIL/uL (ref 3.87–5.11)
RDW: 14.1 % (ref 11.5–15.5)
WBC: 4.5 10*3/uL (ref 4.0–10.5)

## 2015-05-14 LAB — COMPREHENSIVE METABOLIC PANEL
ALT: 13 U/L — ABNORMAL LOW (ref 14–54)
AST: 19 U/L (ref 15–41)
Albumin: 4.2 g/dL (ref 3.5–5.0)
Alkaline Phosphatase: 46 U/L (ref 38–126)
Anion gap: 7 (ref 5–15)
BILIRUBIN TOTAL: 0.5 mg/dL (ref 0.3–1.2)
BUN: 10 mg/dL (ref 6–20)
CO2: 22 mmol/L (ref 22–32)
CREATININE: 1.22 mg/dL — AB (ref 0.44–1.00)
Calcium: 9.2 mg/dL (ref 8.9–10.3)
Chloride: 108 mmol/L (ref 101–111)
GFR calc Af Amer: >60 mL/min (ref >60)
GLUCOSE: 99 mg/dL (ref 65–99)
Potassium: 3.9 mmol/L (ref 3.5–5.1)
Sodium: 137 mmol/L (ref 135–145)
Total Protein: 7.3 g/dL (ref 6.5–8.1)

## 2015-05-14 LAB — POC URINE PREG, ED: Preg Test, Ur: NEGATIVE

## 2015-05-14 LAB — LIPASE, BLOOD: Lipase: 16 U/L — ABNORMAL LOW (ref 22–51)

## 2015-05-14 MED ORDER — ONDANSETRON 4 MG PO TBDP
ORAL_TABLET | ORAL | Status: AC
  Filled 2015-05-14: qty 2

## 2015-05-14 MED ORDER — HYDROCODONE-ACETAMINOPHEN 5-325 MG PO TABS: 1.0000 | ORAL_TABLET | Freq: Four times a day (QID) | ORAL | Status: DC | PRN

## 2015-05-14 MED ORDER — ONDANSETRON 4 MG PO TBDP
8.0000 mg | ORAL_TABLET | Freq: Once | ORAL | Status: AC
  Administered 2015-05-14: 8 mg via ORAL

## 2015-05-14 MED ORDER — MORPHINE SULFATE 4 MG/ML IJ SOLN
4.0000 mg | Freq: Once | INTRAMUSCULAR | Status: AC
  Administered 2015-05-14: 4 mg via INTRAVENOUS
  Filled 2015-05-14: qty 1

## 2015-05-14 MED ORDER — IOHEXOL 300 MG/ML  SOLN
25.0000 mL | Freq: Once | INTRAMUSCULAR | Status: AC | PRN
  Administered 2015-05-14: 25 mL via INTRAVENOUS

## 2015-05-14 MED ORDER — SODIUM CHLORIDE 0.9 % IV BOLUS (SEPSIS)
1000.0000 mL | Freq: Once | INTRAVENOUS | Status: AC
  Administered 2015-05-14: 1000 mL via INTRAVENOUS

## 2015-05-14 MED ORDER — IOHEXOL 300 MG/ML  SOLN
100.0000 mL | Freq: Once | INTRAMUSCULAR | Status: AC | PRN
  Administered 2015-05-14: 100 mL via INTRAVENOUS

## 2015-05-14 MED ORDER — ONDANSETRON 4 MG PO TBDP: ORAL_TABLET | ORAL | Status: DC

## 2015-05-14 NOTE — ED Provider Notes (Signed)
CSN: [id number]     Arrival date & time 05/14/15  1616 History   This chart was scribed for Dierdre Forth, PA-C working with Vanetta Mulders, MD by Elveria Rising, ED Scribe. This patient was seen in room TR11C/TR11C and the patient's care was started at 4:52 PM.   Chief Complaint  Patient presents with  . Nausea  . Emesis   The history is provided by the patient and medical records. No language interpreter was used.   HPI Comments: Melanie Campbell is a 19 y.o. female who presents to the Emergency Department complaining of stabbing lower abdominal pain, nausea, and watery diarrhea for two days and three episodes of vomiting today after eating baked chicken prepared by her aunt two days ago. Patient states that her symptoms presented very soon after eating and have worsened since presentation. Patient reports later development of sore throat, headache, and subjective fever after the vomiting. Patient denies known sick contacts.  Patient denies melena, hematemesis, rash, dysuria, or vaginal discharge. Patient dates LMP beginning of June. Patient is not sexually active.   Past Medical History  Diagnosis Date  . Asthma   . Anxiety    History reviewed. No pertinent past surgical history. History reviewed. No pertinent family history. History  Substance Use Topics  . Smoking status: Never Smoker   . Smokeless tobacco: Not on file  . Alcohol Use: Yes   OB History    No data available     Review of Systems  Constitutional: Positive for fever (subjective). Negative for chills, diaphoresis, appetite change, fatigue and unexpected weight change.  HENT: Positive for sore throat. Negative for mouth sores and trouble swallowing.   Respiratory: Negative for cough, chest tightness, shortness of breath, wheezing and stridor.   Cardiovascular: Negative for chest pain, palpitations and leg swelling.  Gastrointestinal: Positive for nausea, vomiting, abdominal pain and diarrhea. Negative for  constipation, blood in stool, abdominal distention and rectal pain.  Genitourinary: Negative for dysuria, urgency, frequency, hematuria, flank pain, vaginal discharge and difficulty urinating.  Musculoskeletal: Negative for back pain, neck pain and neck stiffness.  Skin: Negative for color change and rash.  Neurological: Negative for weakness.  Hematological: Negative for adenopathy.  Psychiatric/Behavioral: Negative for confusion.  All other systems reviewed and are negative.     Allergies  Review of patient's allergies indicates no known allergies.  Home Medications   Prior to Admission medications   Medication Sig Start Date End Date Taking? Authorizing Provider  albuterol (PROVENTIL HFA;VENTOLIN HFA) 108 (90 BASE) MCG/ACT inhaler Inhale 1-2 puffs into the lungs every 6 (six) hours as needed for wheezing or shortness of breath. 10/15/14   Kaitlyn Szekalski, PA-C  albuterol (PROVENTIL HFA;VENTOLIN HFA) 108 (90 BASE) MCG/ACT inhaler Inhale 2 puffs into the lungs every 4 (four) hours as needed for wheezing or shortness of breath. 12/06/14   Nelva Nay, MD  albuterol (PROVENTIL HFA;VENTOLIN HFA) 108 (90 BASE) MCG/ACT inhaler Inhale 1-2 puffs into the lungs every 6 (six) hours as needed for wheezing or shortness of breath. 04/07/15   Jennifer Piepenbrink, PA-C  HYDROcodone-acetaminophen (NORCO/VICODIN) 5-325 MG per tablet Take 1-2 tablets by mouth every 6 (six) hours as needed for moderate pain or severe pain. 05/14/15   Hajime Asfaw, PA-C  ondansetron (ZOFRAN ODT) 4 MG disintegrating tablet  ODT q4 hours prn nausea/vomit 05/14/15   Kolbi Tofte, PA-C  predniSONE (DELTASONE) 20 MG tablet Take 2 tablets (40 mg total) by mouth daily. 10/15/14   Kaitlyn Szekalski, PA-C  predniSONE (DELTASONE)  20 MG tablet Take 2 tablets (40 mg total) by mouth daily. 04/07/15   Francee Piccolo, PA-C   Triage Vitals: BP 134/65 mmHg  Pulse 93  Temp(Src) 98.1 F (36.7 C) (Oral)  Resp 20  Wt  155 lb (70.308 kg)  SpO2 100%  LMP 04/23/2015 Physical Exam  Constitutional: She appears well-developed and well-nourished. No distress.  HENT:  Head: Normocephalic and atraumatic.  Mouth/Throat: Oropharynx is clear and moist.  Eyes: Conjunctivae are normal. No scleral icterus.  Neck: Normal range of motion.  Cardiovascular: Normal rate, regular rhythm, normal heart sounds and intact distal pulses.   No murmur heard. Pulmonary/Chest: Effort normal and breath sounds normal. No respiratory distress. She has no wheezes.  Abdominal: Soft. Bowel sounds are normal. She exhibits no distension and no mass. There is tenderness in the right lower quadrant, suprapubic area and left lower quadrant. There is guarding (RLQ). There is no rebound and no CVA tenderness. Hernia confirmed negative in the right inguinal area and confirmed negative in the left inguinal area.  Genitourinary: Uterus normal. No labial fusion. There is no rash, tenderness or lesion on the right labia. There is no rash, tenderness or lesion on the left labia. Uterus is not deviated, not enlarged, not fixed and not tender. Cervix exhibits no motion tenderness, no discharge and no friability. Right adnexum displays no mass, no tenderness and no fullness. Left adnexum displays no mass, no tenderness and no fullness. No erythema, tenderness or bleeding in the vagina. No foreign body around the vagina. No signs of injury around the vagina. Vaginal discharge (small, white, nonodorous) found.  Musculoskeletal: Normal range of motion. She exhibits no edema.  Lymphadenopathy:       Right: No inguinal adenopathy present.       Left: No inguinal adenopathy present.  Neurological: She is alert.  Skin: Skin is warm and dry. She is not diaphoretic. No erythema.  Psychiatric: She has a normal mood and affect.  Nursing note and vitals reviewed.   ED Course  Procedures (including critical care time)  COORDINATION OF CARE: 5:02 PM- Discussed  treatment plan with patient at bedside and patient agreed to plan.   Labs Review Labs Reviewed  WET PREP, GENITAL - Abnormal; Notable for the following:    Clue Cells Wet Prep HPF POC FEW (*)    WBC, Wet Prep HPF POC FEW (*)    All other components within normal limits  COMPREHENSIVE METABOLIC PANEL - Abnormal; Notable for the following:    Creatinine, Ser 1.22 (*)    ALT 13 (*)    All other components within normal limits  LIPASE, BLOOD - Abnormal; Notable for the following:    Lipase 16 (*)    All other components within normal limits  CBC WITH DIFFERENTIAL/PLATELET  URINALYSIS, ROUTINE W REFLEX MICROSCOPIC (NOT AT [facility])  POC URINE PREG, ED  GC/CHLAMYDIA PROBE AMP ([place]) NOT AT [facility]    Imaging Review Ct Abdomen Pelvis W Contrast  05/14/2015   CLINICAL DATA:  Lower abdominal pain  EXAM: CT ABDOMEN AND PELVIS WITH CONTRAST  TECHNIQUE: Multidetector CT imaging of the abdomen and pelvis was performed using the standard protocol following bolus administration of intravenous contrast. Oral contrast was also administered.  CONTRAST:  OMNIPAQUE IOHEXOL 300 MG/ML  SOLN  COMPARISON:  None.  FINDINGS: Lung bases are clear.  No focal liver lesions are identified. The gallbladder Col[place]i is not thickened. There is no biliary duct dilatation.  Spleen, pancreas, and adrenals appear  normal. Kidneys bilaterally show no mass or hydronephrosis on either side. There is no renal or ureteral calculus on either side.  In the pelvis, urinary bladder is midline with normal [place] thickness. There is enhancement along the periphery of a right ovarian cyst. This a right ovarian cyst measures 3.5 x 2.7 cm. There is fluid tracking toward the left and posteriorly from this cyst in the cul-de-sac region. No other pelvic mass.  The appendix appears normal.  There is no bowel obstruction. No free air or portal venous air. There is no demonstrable adenopathy or abscess in the abdomen or pelvis. Aorta appears normal.  There are no blastic or lytic bone lesions.  IMPRESSION: Findings felt to represent recent right ovarian cyst rupture/leakage with fluid in the cul-de-sac. The enhancement of the Alms of this right ovarian cyst is felt to be secondary to the recent rupture/leakage.  Appendix appears normal. No renal or ureteral calculus. No hydronephrosis. No bowel obstruction. No abscess.   Electronically Signed   By: Bretta Bang III M.D.   On: 05/14/2015 18:12     EKG Interpretation None      MDM   Final diagnoses:  Lower abdominal pain  Ruptured ovarian cyst    Melanie Campbell presents with lower abdominal pain onset 3 days ago.  Pt with RLQ abd pain, tenderness and guarding; no rebound or peritoneal signs.  Will check labs, UA, preg, obtain CT and give fluid and pain control.  7:08 PM CT with evidence of ruptured right ovarian cyst; no evidence of appendicitis.  Patient is nontoxic, nonseptic appearing, in no apparent distress.  Patient's pain and other symptoms adequately managed in emergency department.  Fluid bolus given.  Labs, imaging and vitals reviewed.  Patient does not meet the SIRS or Sepsis criteria.  On repeat exam patient does not have a surgical abdomin and there are no peritoneal signs.  No indication of appendicitis, bowel obstruction, bowel perforation, cholecystitis, diverticulitis, PID or ectopic pregnancy.  Patient discharged home with symptomatic treatment and given strict instructions for follow-up with their primary care physician.  I have also discussed reasons to return immediately to the ER.  Patient expresses understanding and agrees with plan.  BP 108/66 mmHg  Pulse 90  Temp(Src) 98.1 F (36.7 C) (Oral)  Resp 16  Wt 155 lb (70.308 kg)  SpO2 100%  LMP 04/23/2015  I personally performed the services described in this documentation, which was scribed in my presence. The recorded information has been reviewed and is accurate.     Dahlia Client Airam Heidecker, PA-C 05/14/15  1910  Vanetta Mulders, MD 05/18/15 [phone]

## 2015-05-14 NOTE — ED Notes (Signed)
Pt in c/o nausea x2 days, vomited x3 times today, also c/o sore throat, unsure of fever at home, no distress noted

## 2015-05-14 NOTE — Discharge Instructions (Signed)
1. Medications: zofran, vicodin, usual home medications 2. Treatment: rest, drink plenty of fluids, advance diet slowly 3. Follow Up: Please followup with your primary doctor in 2 days for discussion of your diagnoses and further evaluation after today's visit; if you do not have a primary care doctor use the resource guide provided to find one; Please return to the ER for persistent vomiting, high fevers or worsening symptoms   Ovarian Cyst An ovarian cyst is a fluid-filled sac that forms on an ovary. The ovaries are small organs that produce eggs in women. Various types of cysts can form on the ovaries. Most are not cancerous. Many do not cause problems, and they often go away on their own. Some may cause symptoms and require treatment. Common types of ovarian cysts include:  Functional cysts--These cysts may occur every month during the menstrual cycle. This is normal. The cysts usually go away with the next menstrual cycle if the woman does not get pregnant. Usually, there are no symptoms with a functional cyst.  Endometrioma cysts--These cysts form from the tissue that lines the uterus. They are also called "chocolate cysts" because they become filled with blood that turns brown. This type of cyst can cause pain in the lower abdomen during intercourse and with your menstrual period.  Cystadenoma cysts--This type develops from the cells on the outside of the ovary. These cysts can get very big and cause lower abdomen pain and pain with intercourse. This type of cyst can twist on itself, cut off its blood supply, and cause severe pain. It can also easily rupture and cause a lot of pain.  Dermoid cysts--This type of cyst is sometimes found in both ovaries. These cysts may contain different kinds of body tissue, such as skin, teeth, hair, or cartilage. They usually do not cause symptoms unless they get very big.  Theca lutein cysts--These cysts occur when too much of a certain hormone (human  chorionic gonadotropin) is produced and overstimulates the ovaries to produce an egg. This is most common after procedures used to assist with the conception of a baby (in vitro fertilization). CAUSES   Fertility drugs can cause a condition in which multiple large cysts are formed on the ovaries. This is called ovarian hyperstimulation syndrome.  A condition called polycystic ovary syndrome can cause hormonal imbalances that can lead to nonfunctional ovarian cysts. SIGNS AND SYMPTOMS  Many ovarian cysts do not cause symptoms. If symptoms are present, they may include:  Pelvic pain or pressure.  Pain in the lower abdomen.  Pain during sexual intercourse.  Increasing girth (swelling) of the abdomen.  Abnormal menstrual periods.  Increasing pain with menstrual periods.  Stopping having menstrual periods without being pregnant. DIAGNOSIS  These cysts are commonly found during a routine or annual pelvic exam. Tests may be ordered to find out more about the cyst. These tests may include:  Ultrasound.  X-ray of the pelvis.  CT scan.  MRI.  Blood tests. TREATMENT  Many ovarian cysts go away on their own without treatment. Your health care provider may want to check your cyst regularly for 2-3 months to see if it changes. For women in menopause, it is particularly important to monitor a cyst closely because of the higher rate of ovarian cancer in menopausal women. When treatment is needed, it may include any of the following:  A procedure to drain the cyst (aspiration). This may be done using a long needle and ultrasound. It can also be done through a laparoscopic  procedure. This involves using a thin, lighted tube with a tiny camera on the end (laparoscope) inserted through a small incision.  Surgery to remove the whole cyst. This may be done using laparoscopic surgery or an open surgery involving a larger incision in the lower abdomen.  Hormone treatment or birth control pills.  These methods are sometimes used to help dissolve a cyst. HOME CARE INSTRUCTIONS   Only take over-the-counter or prescription medicines as directed by your health care provider.  Follow up with your health care provider as directed.  Get regular pelvic exams and Pap tests. SEEK MEDICAL CARE IF:   Your periods are late, irregular, or painful, or they stop.  Your pelvic pain or abdominal pain does not go away.  Your abdomen becomes larger or swollen.  You have pressure on your bladder or trouble emptying your bladder completely.  You have pain during sexual intercourse.  You have feelings of fullness, pressure, or discomfort in your stomach.  You lose weight for no apparent reason.  You feel generally ill.  You become constipated.  You lose your appetite.  You develop acne.  You have an increase in body and facial hair.  You are gaining weight, without changing your exercise and eating habits.  You think you are pregnant. SEEK IMMEDIATE MEDICAL CARE IF:   You have increasing abdominal pain.  You feel sick to your stomach (nauseous), and you throw up (vomit).  You develop a fever that comes on suddenly.  You have abdominal pain during a bowel movement.  Your menstrual periods become heavier than usual. MAKE SURE YOU:  Understand these instructions.  Will watch your condition.  Will get help right away if you are not doing well or get worse. Document Released: 11/08/2005 Document Revised: 11/13/2013 Document Reviewed: 07/16/2013 [facility] Patient Information 2015 [place], [place]. This information is not intended to replace advice given to you by your health care provider. Make sure you discuss any questions you have with your health care provider.    Emergency Department Resource Guide 1) Find a Doctor and Pay Out of Pocket Although you won't have to find out who is covered by your insurance plan, it is a good idea to ask around and get recommendations. You  will then need to call the office and see if the doctor you have chosen will accept you as a new patient and what types of options they offer for patients who are self-pay. Some doctors offer discounts or will set up payment plans for their patients who do not have insurance, but you will need to ask so you aren't surprised when you get to your appointment.  2) Contact Your Local Health Department Not all health departments have doctors that can see patients for sick visits, but many do, so it is worth a call to see if yours does. If you don't know where your local health department is, you can check in your phone book. The CDC also has a tool to help you locate your state's health department, and many state websites also have listings of all of their local health departments.  3) Find a Walk-in Clinic If your illness is not likely to be very severe or complicated, you may want to try a walk in clinic. These are popping up all over the country in pharmacies, drugstores, and shopping centers. They're usually staffed by nurse practitioners or physician assistants that have been trained to treat common illnesses and complaints. They're usually fairly quick and inexpensive. However,  if you have serious medical issues or chronic medical problems, these are probably not your best option.  No Primary Care Doctor: - Call Health Connect at  [phone] - they can help you locate a primary care doctor that  accepts your insurance, provides certain services, etc. - Physician Referral Service- [phone]  Chronic Pain Problems: Organization         Address  Phone   Notes  [facility]  [phone] Patients need to be referred by their primary care doctor.   Medication Assistance: Organization         Address  Phone   Notes  [facility] Medication [facility] [address] [place] [place], [place] [address] [phone] --Must be a resident of [facility] -- Must  have NO insurance coverage whatsoever (no Medicaid/ Medicare, etc.) -- The pt. MUST have a primary care doctor that directs their care regularly and follows them in the community   [facility]  [phone]   [employer]  [phone]    Agencies that provide inexpensive medical care: Organization         Address  Phone   Notes  [facility]  [phone]   [facility]    [phone]   [facility] [address] [place], [place] [address] [phone]   [facility] [address] [place] [place] [phone]   Planned Parenthood    [phone]   [facility]    [phone]   [facility] and [facility]  [address], [place] Phone:  [phone], Fax:  [phone] Hours of Operation:  9 am - 6 pm, M-F.  Also accepts Medicaid/Medicare and self-pay.  [facility]  [address], [place] Phone: [phone], Fax: [phone]. Hours of Operation:  8:30 am - 5:30 pm, M-F.  Also accepts Medicaid and self-pay.  [facility] [place] [address] [place] Phone: [phone]   [facility] [address] Natasha Bence [place], [place] [phone], Ext. [phone] Mondays & Thursdays: 7-9 AM.  First 15 patients are seen on a first come, first serve basis.    Medicaid-accepting [facility] Providers:  Organization         Address  Phone   Notes  [facility] [address], [place] [phone] Also accepts self-pay patients.  [facility] [address] Laurell Josephs [place], [place]  [phone]   [facility] [address], [place] [phone]   [facility] [address], [place] [phone]   Renaye Rakers [address], [place]   [phone] Only accepts [place] Access [place] patients after  they have their name applied to their card.   Self-Pay (no insurance) in [facility]:  Organization         Address  Phone   Notes  Sickle Cell Patients, [facility] [address] [place], [place] [phone]   [facility] [address] [place], [place] [phone]   [facility] [place]  [address], [place] [phone]   [facility]/Dr. Osei-Bonsu  [address] [place] or [address], [place] [phone] Phone number for both [facility]  and [place] locations is the same.  [facility] [address] [place] [phone]   [facility] [address], [place] or [address] [phone]   [facility] [address] [place] [phone], phone; [phone], fax Sees patients 1st and 3rd Saturday of every month.  Must not qualify for public or private insurance (i.e. Medicaid, Medicare, [place] Health Choice, Veterans' Benefits)  Household income should be no more than 200% of the poverty level The clinic cannot treat you if you are pregnant or think you are pregnant  Sexually transmitted diseases are not treated at the clinic.    Dental Care: Organization         Address  Phone  Notes  [facility] Department of [facility] [address] [place], [place] [phone] Accepts children up to age 39 who are enrolled in [place] or [place] Health Choice; pregnant women with a Medicaid card; and children who have applied for Medicaid or [place] Health Choice, but were declined, whose parents can pay a reduced fee at time of service.  [facility] Department of [facility]  [address], [place] [phone] Accepts children up to age 42 who are enrolled in [place] or [place] Health Choice; pregnant women with a Medicaid card; and children who  have applied for Medicaid or [place] Health Choice, but were declined, whose parents can pay a reduced fee at time of service.  [facility]  [address] [place], [place] [phone] Patients are seen by appointment only. Walk-ins are not accepted. [facility] will see patients 1 years of age and older. Monday - Tuesday (8am-5pm) Most Wednesdays (8:30-5pm) $30 per visit, cash only  [facility]  [address], [facility] [phone] Patients are seen by appointment only. Walk-ins are not accepted. [facility] will see patients 64 years of age and older. One Wednesday Evening (Monthly: Volunteer Based).  $30 per visit, cash only  [employer]  [phone] for adults; Children under age 42, call Graduate Pediatric Dentistry at [phone]. Children aged 45-14, please call [phone] to request a pediatric application.  Dental services are provided in all areas of dental care including fillings, crowns and bridges, complete and partial dentures, implants, gum treatment, root canals, and extractions. Preventive care is also provided. Treatment is provided to both adults and children. Patients are selected via a lottery and there is often a waiting list.   [facility] [address] [place]  [phone] [email or web address]   [facility] [address] [place], [place] [phone], Ext. [phone] Second and Fourth Thursday of each month, opens at 6:30 AM; Clinic ends at 9 AM.  Patients are seen on a first-come first-served basis, and a limited number are seen during each clinic.   [facility]  [address] Ether [place], [place] [phone]   Eligibility Requirements You must have lived in [place], [place], or [place] counties for at least the last three months.   You cannot be eligible for state or federal sponsored [place], including [employer], [place], or [employer].   You generally cannot be eligible for healthcare insurance through your employer.    How to apply: Eligibility screenings are held every Tuesday and Wednesday afternoon from 1:00 pm until 4:00 pm. You do  not need an appointment for the interview!  [facility] [address], [place], [place] [phone]   [facility]  [phone]   [facility] Health Department  [phone]   [facility] [place] [facility] Health [facility]  [phone]    Behavioral Health Resources in the Community: Intensive Outpatient Programs Organization         Address  Phone  Notes  [facility] [address] [place] [address], [place], [place] [phone]   [facility] Outpatient [address] [place], [place] [phone]   [facility] [address] [place], [place]  [phone]   [facility] Mental Health [address],  [place], [place] [phone] or [phone]   Substance Abuse Resources Organization         Address  Phone  Notes  [facility]  [phone]   [facility]  [phone]   [facility] [place]  [phone]   Floydene Flock  [phone]   Residential & Outpatient Substance Abuse Program  [phone]   Psychological Services Organization         Address  Phone  Notes  [facility] Behavioral Health  [phone]   [facility]  [phone]   [facility] Mental Health [address], [place] [phone] or [phone]    Mobile Crisis Teams Organization         Address  Phone  Notes  [facility], Mobile Crisis Care Unit  [phone]   [facility]  [address] [place], [place] [phone]   Doristine Locks [address] [place] [phone]    Self-Help/Support Groups Organization         Address  Phone             Notes  [facility] [place] -  variety of support groups  336- I7437963 Call for more information  Narcotics Anonymous (NA), [facility] [address], [employer] [place]  2 meetings at this location   Statistician         Address  Phone  Notes  [facility] [address],    [place]  [phone]   [facility]  [address], [place] [address], [place], [place] [phone]   [facility] Treatment [facility] [address] [place], [place] [phone] Admissions: 8am-3pm M-F  [facility] [address],    [place], [place] [phone]   [facility] [address] [place], [place], [place] [phone]   [facility] [address]  [place], [place] [phone]   [facility] - Intensive Outpatient [address] Laurell Joseph[address], [place], [place] [phone]   [facility] ([facility]iction [facility] [address] [place]  [place], [place] [phone] or [phone]   [facility] ([facility]) [address] [place], [place] [phone] Accepts Medicaid  [facility] [place] [address],  [place] [phone] Substance Abuse/Addiction Treatment   [facility] Organization         Address  Phone  Notes  [facility]  [phone]   Angie Fava, PhD [address] Ervin Knack [place], [place]   [phone] or [phone]   [facility] Behavioral   [address] [place], [place] [phone]   [facility] [address] [place], [place] [phone] Insurance/Medicaid/sponsorship through [employer] and Families [address]  [place], [place] [phone] Therapy/tele-psych/case  [facility] [address]   [place], [place] [phone]    Dr. Lolly Mustache  [phone]   [facility]  [employer] [facility] Dept. 1) [address]  2) [address], [place] 3)  [address], [place] [phone]  [phone]   [facility] Child Abuse Hotline (901)562-5479 or [phone] (After Hours)

## 2015-05-14 NOTE — ED Notes (Signed)
Patient transported to CT 

## 2015-05-15 LAB — GC/CHLAMYDIA PROBE AMP (~~LOC~~) NOT AT ARMC
Chlamydia: NEGATIVE
NEISSERIA GONORRHEA: NEGATIVE

## 2015-05-28 ENCOUNTER — Ambulatory Visit: Payer: Self-pay | Admitting: Family

## 2015-06-27 IMAGING — CR DG FINGER LITTLE 2+V*L*
3 series · 3 of 3 positions shown · non-contrast
Comparison: None.

CLINICAL DATA: Left hand injury, with finger pain.

EXAM:
LEFT LITTLE FINGER 2+V

[x finger pa left]
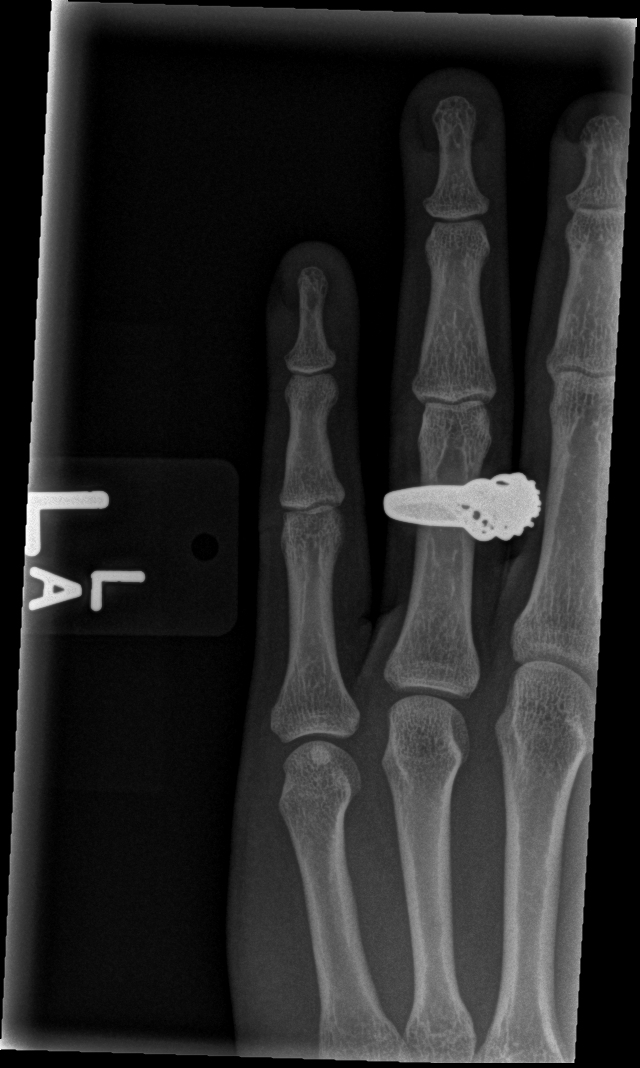

[x finger obl left]
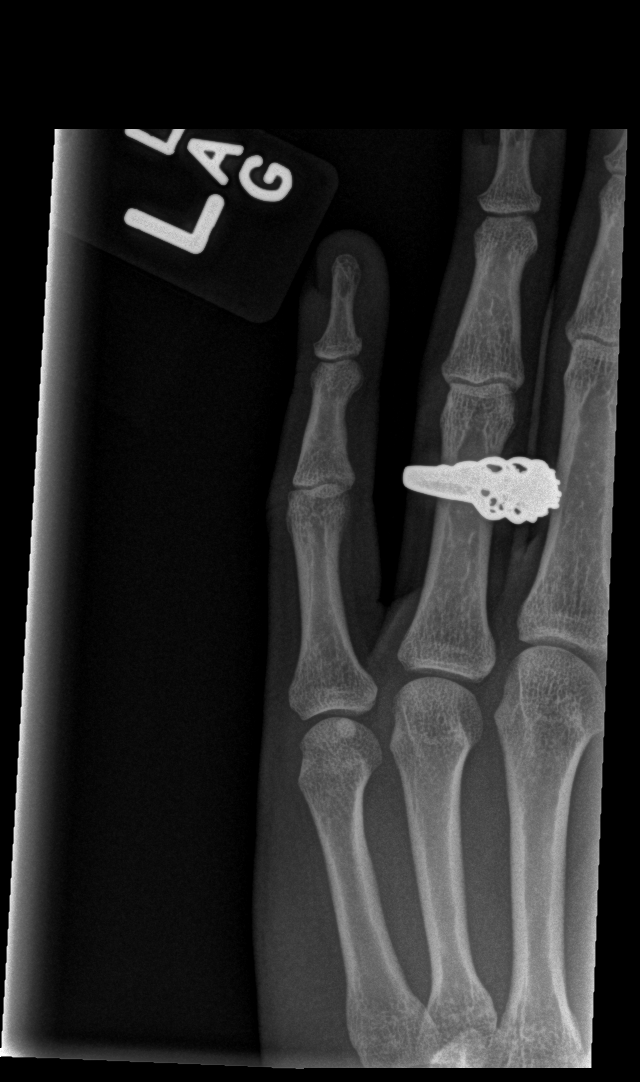

[x finger lat left]
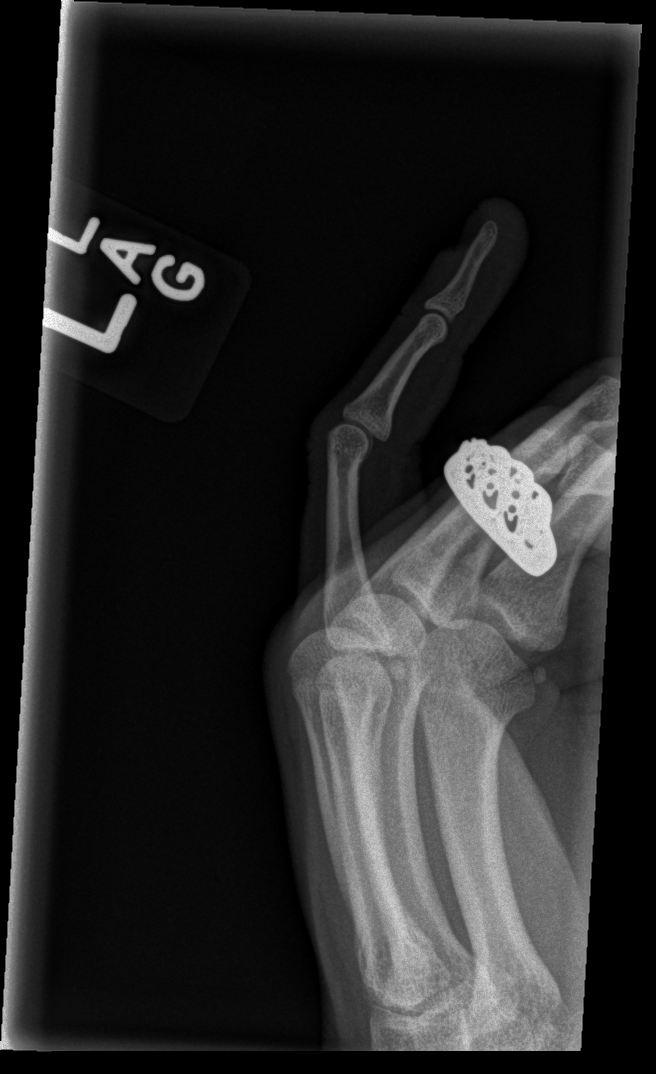

[3 of 3 positions shown; findings below may reference images not displayed]

FINDINGS: There is no evidence of fracture or dislocation. No definite soft
tissue abnormalities are characterized. No radiopaque foreign bodies
are seen. Visualized joint spaces are preserved.
IMPRESSION: No evidence of fracture or dislocation.

## 2015-09-02 ENCOUNTER — Emergency Department (HOSPITAL_COMMUNITY)
Admission: EM | Admit: 2015-09-02 | Discharge: 2015-09-02 | Disposition: A | Discharge status: Home / Self Care | Payer: Medicaid Other | Attending: Emergency Medicine | Admitting: Emergency Medicine

## 2015-09-02 ENCOUNTER — Encounter (HOSPITAL_COMMUNITY): Payer: Self-pay

## 2015-09-02 DIAGNOSIS — Z8659 Personal history of other mental and behavioral disorders: Secondary | ICD-10-CM | POA: Insufficient documentation

## 2015-09-02 DIAGNOSIS — J45909 Unspecified asthma, uncomplicated: Secondary | ICD-10-CM | POA: Insufficient documentation

## 2015-09-02 DIAGNOSIS — Z7952 Long term (current) use of systemic steroids: Secondary | ICD-10-CM | POA: Insufficient documentation

## 2015-09-02 DIAGNOSIS — B9689 Other specified bacterial agents as the cause of diseases classified elsewhere: Secondary | ICD-10-CM

## 2015-09-02 DIAGNOSIS — Z79899 Other long term (current) drug therapy: Secondary | ICD-10-CM | POA: Insufficient documentation

## 2015-09-02 DIAGNOSIS — N760 Acute vaginitis: Secondary | ICD-10-CM | POA: Insufficient documentation

## 2015-09-02 LAB — URINALYSIS, ROUTINE W REFLEX MICROSCOPIC
BILIRUBIN URINE: NEGATIVE
GLUCOSE, UA: NEGATIVE mg/dL
HGB URINE DIPSTICK: NEGATIVE
KETONES UR: NEGATIVE mg/dL
Nitrite: NEGATIVE
PROTEIN: NEGATIVE mg/dL
Specific Gravity, Urine: 1.024 (ref 1.005–1.030)
Urobilinogen, UA: 0.2 mg/dL (ref 0.0–1.0)
pH: 5.5 (ref 5.0–8.0)

## 2015-09-02 LAB — URINE MICROSCOPIC-ADD ON

## 2015-09-02 LAB — WET PREP, GENITAL
TRICH WET PREP: NONE SEEN
YEAST WET PREP: NONE SEEN

## 2015-09-02 MED ORDER — METRONIDAZOLE 500 MG PO TABS: 500.0000 mg | ORAL_TABLET | Freq: Two times a day (BID) | ORAL | Status: DC

## 2015-09-02 MED ORDER — HYDROCORTISONE VALERATE 0.2 % EX OINT
TOPICAL_OINTMENT | Freq: Two times a day (BID) | CUTANEOUS | Status: DC
  Administered 2015-09-02: 1 via TOPICAL
  Filled 2015-09-02: qty 15

## 2015-09-02 MED ORDER — METRONIDAZOLE 500 MG PO TABS
500.0000 mg | ORAL_TABLET | Freq: Once | ORAL | Status: AC
  Administered 2015-09-02: 500 mg via ORAL
  Filled 2015-09-02: qty 1

## 2015-09-02 MED ORDER — HYDROCORTISONE VALERATE 0.2 % EX OINT: 1.0000 "application " | TOPICAL_OINTMENT | Freq: Two times a day (BID) | CUTANEOUS | Status: DC

## 2015-09-02 NOTE — Discharge Instructions (Signed)
Vaginitis Vaginitis is an inflammation of the vagina. It can happen when the normal bacteria and yeast in the vagina grow too much. There are different types. Treatment will depend on the type you have. HOME CARE  Take all medicines as told by your doctor.  Keep your vagina area clean and dry. Avoid soap. Rinse the area with water.  Avoid washing and cleaning out the vagina (douching).  Do not use tampons or have sex (intercourse) until your treatment is done.  Wipe from front to back after going to the restroom.  Wear cotton underwear.  Avoid wearing underwear while you sleep until your vaginitis is gone.  Avoid tight pants. Avoid underwear or nylons without a cotton panel.  Take off wet clothing (such as a bathing suit) as soon as you can.  Use mild, unscented products. Avoid fabric softeners and scented:  Feminine sprays.  Laundry detergents.  Tampons.  Soaps or bubble baths.  Practice safe sex and use condoms. GET HELP RIGHT AWAY IF:   You have belly (abdominal) pain.  You have a fever or lasting symptoms for more than 2-3 days.  You have a fever and your symptoms suddenly get worse. MAKE SURE YOU:   Understand these instructions.  Will watch this condition.  Will get help right away if you are not doing well or get worse.   This information is not intended to replace advice given to you by your health care provider. Make sure you discuss any questions you have with your health care provider.   Document Released: 02/04/2009 Document Revised: 08/02/2012 Document Reviewed: 04/20/2012 Elsevier Interactive Patient Education 2016 Elsevier Inc.  Bacterial Vaginosis Bacterial vaginosis is an infection of the vagina. It happens when too many germs (bacteria) grow in the vagina. Having this infection puts you at risk for getting other infections from sex. Treating this infection can help lower your risk for other infections, such as:    Chlamydia.  Gonorrhea.  HIV.  Herpes. HOME CARE  Take your medicine as told by your doctor.  Finish your medicine even if you start to feel better.  Tell your sex partner that you have an infection. They should see their doctor for treatment.  During treatment:  Avoid sex or use condoms correctly.  Do not douche.  Do not drink alcohol unless your doctor tells you it is ok.  Do not breastfeed unless your doctor tells you it is ok. GET HELP IF:  You are not getting better after 3 days of treatment.  You have more grey fluid (discharge) coming from your vagina than before.  You have more pain than before.  You have a fever. MAKE SURE YOU:   Understand these instructions.  Will watch your condition.  Will get help right away if you are not doing well or get worse.   This information is not intended to replace advice given to you by your health care provider. Make sure you discuss any questions you have with your health care provider.   Document Released: 08/17/2008 Document Revised: 11/29/2014 Document Reviewed: 06/20/2013 Elsevier Interactive Patient Education [employer]

## 2015-09-02 NOTE — ED Provider Notes (Signed)
CSN: [id number]     Arrival date & time 09/02/15  1191 History   First MD Initiated Contact with Patient 09/02/15 [phone]     Chief Complaint  Patient presents with  . Groin Swelling   Patient is a 19 y.o. female presenting with allergic reaction. The history is provided by the patient.  Allergic Reaction Presenting symptoms: itching and swelling   Presenting symptoms: no difficulty breathing, no difficulty swallowing, no rash and no wheezing   Presenting symptoms comment:  Vaginal itching/vaginal swelling Itching:    Location:  Genitalia   Severity:  Moderate   Onset quality:  Sudden   Timing:  Constant   Progression:  Worsening Swelling:    Location:  Genitalia   Onset quality:  Sudden   Timing:  Constant   Progression:  Worsening   Chronicity:  New Severity:  Mild Prior allergic episodes:  No prior episodes Context: no dairy/milk products, no eggs, no food allergies, no grass, no insect bite/sting, no medications, no nuts and no poison ivy   Context comment:  New sex toy Relieved by:  None tried Worsened by:  Nothing tried Ineffective treatments:  None tried    Past Medical History  Diagnosis Date  . Asthma   . Anxiety    No past surgical history on file. No family history on file. Social History  Substance Use Topics  . Smoking status: Never Smoker   . Smokeless tobacco: None  . Alcohol Use: 1.8 oz/week    3 Cans of beer per week   OB History    No data available     Review of Systems  HENT: Negative for trouble swallowing.   Respiratory: Negative for choking, shortness of breath and wheezing.   Cardiovascular: Negative for chest pain.  Gastrointestinal: Negative for nausea, vomiting and diarrhea.  Genitourinary: Negative for dysuria.  Skin: Positive for itching. Negative for rash.  All other systems reviewed and are negative.     Allergies  Review of patient's allergies indicates no known allergies.  Home Medications   Prior to Admission  medications   Medication Sig Start Date End Date Taking? Authorizing Provider  albuterol (PROVENTIL HFA;VENTOLIN HFA) 108 (90 BASE) MCG/ACT inhaler Inhale 1-2 puffs into the lungs every 6 (six) hours as needed for wheezing or shortness of breath. 10/15/14   Kaitlyn Szekalski, PA-C  albuterol (PROVENTIL HFA;VENTOLIN HFA) 108 (90 BASE) MCG/ACT inhaler Inhale 2 puffs into the lungs every 4 (four) hours as needed for wheezing or shortness of breath. 12/06/14   Nelva Nay, MD  albuterol (PROVENTIL HFA;VENTOLIN HFA) 108 (90 BASE) MCG/ACT inhaler Inhale 1-2 puffs into the lungs every 6 (six) hours as needed for wheezing or shortness of breath. 04/07/15   Jennifer Piepenbrink, PA-C  HYDROcodone-acetaminophen (NORCO/VICODIN) 5-325 MG per tablet Take 1-2 tablets by mouth every 6 (six) hours as needed for moderate pain or severe pain. 05/14/15   Hannah Muthersbaugh, PA-C  ondansetron (ZOFRAN ODT) 4 MG disintegrating tablet  ODT q4 hours prn nausea/vomit 05/14/15   Hannah Muthersbaugh, PA-C  predniSONE (DELTASONE) 20 MG tablet Take 2 tablets (40 mg total) by mouth daily. 10/15/14   Kaitlyn Szekalski, PA-C  predniSONE (DELTASONE) 20 MG tablet Take 2 tablets (40 mg total) by mouth daily. 04/07/15   Francee Piccolo, PA-C   There were no vitals taken for this visit. Physical Exam  Constitutional: She is oriented to person, place, and time. She appears well-developed and well-nourished. No distress.  HENT:  Head: Normocephalic.  Eyes: Pupils are  equal, round, and reactive to light.  Neck: Normal range of motion.  Cardiovascular: Normal rate.   Pulmonary/Chest: Effort normal. No respiratory distress. She has no wheezes.  Abdominal: Soft. She exhibits no distension. There is no tenderness. There is no rebound and no guarding.  Musculoskeletal: Normal range of motion.  Neurological: She is alert and oriented to person, place, and time. No cranial nerve deficit. She exhibits normal muscle tone. Coordination  normal.  Skin: Skin is warm and dry. She is not diaphoretic. No erythema.  Psychiatric: She has a normal mood and affect. Her behavior is normal. Judgment and thought content normal.  Nursing note and vitals reviewed.   ED Course  Procedures (including critical care time) Labs Review Labs Reviewed  WET PREP, GENITAL - Abnormal; Notable for the following:    Clue Cells Wet Prep HPF POC FEW (*)    WBC, Wet Prep HPF POC MANY (*)    All other components within normal limits  URINALYSIS, ROUTINE W REFLEX MICROSCOPIC (NOT AT [facility]) - Abnormal; Notable for the following:    Leukocytes, UA SMALL (*)    All other components within normal limits  URINE MICROSCOPIC-ADD ON - Abnormal; Notable for the following:    Squamous Epithelial / LPF FEW (*)    Bacteria, UA FEW (*)    All other components within normal limits  GC/CHLAMYDIA PROBE AMP ([place]) NOT AT [facility]     MDM   Patient presents emergency department today for possible allergic reaction to a sex toy. Patient and her partner were using a new sex toy on Saturday and since that time she has had vaginal swelling, vaginal discharge that is white, as well as itching. Patient denies any other systemic symptoms of allergic reaction including shortness of breath, wheezing, nausea, vomiting, and oropharyngeal swelling. She states that both her and her partner were using the same toy. Patient denies any other previous histories and does not know the makeup of the sex toys.   Patient's wet prep is positive for a few clue cells. Given her white discharge and irritation this is more than likely due to chemical irritant. She does however have clue cells with irritation, so we will be treating her for bacterial vaginosis as well. Patient had no cervical motion tenderness, does not have any concerns for STDs and will not be empirically treated for gonorrhea or chlamydia. Patient only has female partners and states that there is no possible way that she  could be pregnant.  Patient was advised not to drink alcohol with her metronidazole was discharged home. She'll follow up with her primary care physician as needed.   Final diagnoses:  Vaginitis  Bacterial vaginosis      Deirdre Peer, MD 09/02/15 1523  Pricilla Loveless, MD 09/09/15 [phone]

## 2015-09-02 NOTE — ED Notes (Signed)
Pt. Presents with complaint of vaginal swelling from possible allergic rxn. Pt. States pain is 9/10. Swelling began on Saturday following use of sex toy.

## 2015-09-03 LAB — GC/CHLAMYDIA PROBE AMP (~~LOC~~) NOT AT ARMC
CHLAMYDIA, DNA PROBE: NEGATIVE
Neisseria Gonorrhea: NEGATIVE

## 2016-07-05 ENCOUNTER — Emergency Department (HOSPITAL_COMMUNITY): Payer: Self-pay

## 2016-07-05 ENCOUNTER — Encounter (HOSPITAL_COMMUNITY): Payer: Self-pay | Admitting: Emergency Medicine

## 2016-07-05 ENCOUNTER — Emergency Department (HOSPITAL_COMMUNITY)
Admission: EM | Admit: 2016-07-05 | Discharge: 2016-07-06 | Disposition: A | Discharge status: Home / Self Care | Payer: Self-pay | Attending: Dermatology | Admitting: Dermatology

## 2016-07-05 DIAGNOSIS — Z5321 Procedure and treatment not carried out due to patient leaving prior to being seen by health care provider: Secondary | ICD-10-CM | POA: Insufficient documentation

## 2016-07-05 DIAGNOSIS — R0602 Shortness of breath: Secondary | ICD-10-CM | POA: Insufficient documentation

## 2016-07-05 DIAGNOSIS — J45909 Unspecified asthma, uncomplicated: Secondary | ICD-10-CM | POA: Insufficient documentation

## 2016-07-05 MED ORDER — ALBUTEROL SULFATE (2.5 MG/3ML) 0.083% IN NEBU
5.0000 mg | INHALATION_SOLUTION | Freq: Once | RESPIRATORY_TRACT | Status: AC
  Administered 2016-07-05: 5 mg via RESPIRATORY_TRACT

## 2016-07-05 MED ORDER — ALBUTEROL SULFATE (2.5 MG/3ML) 0.083% IN NEBU
INHALATION_SOLUTION | RESPIRATORY_TRACT | Status: AC
  Filled 2016-07-05: qty 6

## 2016-07-05 NOTE — ED Notes (Signed)
Pt no answer when called from waiting room for room placement.

## 2016-07-05 NOTE — ED Triage Notes (Signed)
History of asthma; sore throat, congestion, cough for last three days. Today ran out of inhaler for asthma and is more SOB.

## 2018-03-01 ENCOUNTER — Ambulatory Visit: Payer: Self-pay | Admitting: Medical

## 2018-03-01 DIAGNOSIS — Z0289 Encounter for other administrative examinations: Secondary | ICD-10-CM

## 2018-03-06 ENCOUNTER — Ambulatory Visit: Payer: Self-pay | Admitting: Medical

## 2018-12-01 ENCOUNTER — Other Ambulatory Visit: Payer: Self-pay

## 2018-12-01 ENCOUNTER — Encounter (HOSPITAL_COMMUNITY): Payer: Self-pay | Admitting: Student

## 2018-12-01 ENCOUNTER — Emergency Department (HOSPITAL_COMMUNITY)
Admission: EM | Admit: 2018-12-01 | Discharge: 2018-12-01 | Disposition: A | Discharge status: Home / Self Care | Payer: Medicaid Other | Attending: Emergency Medicine | Admitting: Emergency Medicine

## 2018-12-01 DIAGNOSIS — Z79899 Other long term (current) drug therapy: Secondary | ICD-10-CM | POA: Insufficient documentation

## 2018-12-01 DIAGNOSIS — J45901 Unspecified asthma with (acute) exacerbation: Secondary | ICD-10-CM | POA: Insufficient documentation

## 2018-12-01 HISTORY — DX: Pre-excitation syndrome: I45.6

## 2018-12-01 MED ORDER — PREDNISONE 50 MG PO TABS: 50.0000 mg | ORAL_TABLET | Freq: Every day | ORAL | 0 refills | Status: DC

## 2018-12-01 MED ORDER — ALBUTEROL SULFATE HFA 108 (90 BASE) MCG/ACT IN AERS
1.0000 | INHALATION_SPRAY | Freq: Once | RESPIRATORY_TRACT | Status: AC
  Administered 2018-12-01: 2 via RESPIRATORY_TRACT
  Filled 2018-12-01: qty 6.7

## 2018-12-01 MED ORDER — PREDNISONE 20 MG PO TABS
60.0000 mg | ORAL_TABLET | Freq: Once | ORAL | Status: AC
Start: 2018-12-01 — End: 2018-12-01
  Administered 2018-12-01: 60 mg via ORAL
  Filled 2018-12-01: qty 3

## 2018-12-01 NOTE — ED Triage Notes (Signed)
Wheezing x 1 week, ran out of inhaler x 1 month, started  New job and has no insurance

## 2018-12-01 NOTE — Discharge Instructions (Addendum)
You were seen in the ER today for asthma.  Your lungs sounded clear here.  We gave you an inhaler please use 1-2 puffs every 4-6 hours for wheezing or shortness of breath.  We gave you prednisone for the exacerbation take this daily for 5 days.   We have prescribed you new medication(s) today. Discuss the medications prescribed today with your pharmacist as they can have adverse effects and interactions with your other medicines including over the counter and prescribed medications. Seek medical evaluation if you start to experience new or abnormal symptoms after taking one of these medicines, seek care immediately if you start to experience difficulty breathing, feeling of your throat closing, facial swelling, or rash as these could be indications of a more serious allergic reaction   Follow up with primary care within 1 week. Return to the ER for new or worsening symptoms or any other concerns.

## 2018-12-01 NOTE — ED Provider Notes (Signed)
[facility] EMERGENCY DEPARTMENT Provider Note   CSN: [id number] Arrival date & time: 12/01/18  0944     History   Chief Complaint Chief Complaint  Patient presents with  . Asthma    HPI Melanie Campbell is a 23 y.o. female with a hx of asthma & anxiety who presents to the ED with complaints of asthma x 1 week. She states she has had intermittent dry cough, wheezing, and dyspnea similar to prior asthma issues, she states this feels like exacerbations requiring steroids in the past. States she ran out of her inhaler, but has been able to utilize her nebulizer machine at home with improvement. She used this this AM prior to arrival. She states this was the last of her solution and she now does not have access to albuterol via neb or inhaler and does not have insurance. She states right now she is not feeling short of breath and does not feel she is wheezing, she wanted to come in though due to being out of albuterol at this time. Denies fever, chills, chest pain, chest tightness,  leg pain/swelling, hemoptysis, recent surgery/trauma, recent long travel, hormone use, personal hx of cancer, or hx of DVT/PE.   HPI  Past Medical History:  Diagnosis Date  . Anxiety   . Asthma     There are no active problems to display for this patient.   No past surgical history on file.   OB History   No obstetric history on file.      Home Medications    Prior to Admission medications   Medication Sig Start Date End Date Taking? Authorizing Provider  albuterol (PROVENTIL HFA;VENTOLIN HFA) 108 (90 BASE) MCG/ACT inhaler Inhale 1-2 puffs into the lungs every 6 (six) hours as needed for wheezing or shortness of breath. Patient not taking: Reported on 09/02/2015 10/15/14   Emilia Beck, PA-C  albuterol (PROVENTIL HFA;VENTOLIN HFA) 108 (90 BASE) MCG/ACT inhaler Inhale 2 puffs into the lungs every 4 (four) hours as needed for wheezing or shortness of breath. 12/06/14   Nelva Nay, MD  albuterol (PROVENTIL HFA;VENTOLIN HFA) 108 (90 BASE) MCG/ACT inhaler Inhale 1-2 puffs into the lungs every 6 (six) hours as needed for wheezing or shortness of breath. Patient not taking: Reported on 09/02/2015 04/07/15   Piepenbrink, Victorino Dike, PA-C  HYDROcodone-acetaminophen (NORCO/VICODIN) 5-325 MG per tablet Take 1-2 tablets by mouth every 6 (six) hours as needed for moderate pain or severe pain. Patient not taking: Reported on 09/02/2015 05/14/15   Muthersbaugh, Dahlia Client, PA-C  hydrocortisone valerate ointment (WESTCORT) 0.2 % Apply 1 application topically 2 (two) times daily. 09/02/15   Deirdre Peer, MD  metroNIDAZOLE (FLAGYL) 500 MG tablet Take 1 tablet (500 mg total) by mouth 2 (two) times daily. 09/02/15   Deirdre Peer, MD  ondansetron (ZOFRAN ODT) 4 MG disintegrating tablet 4mg  ODT q4 hours prn nausea/vomit Patient not taking: Reported on 09/02/2015 05/14/15   Muthersbaugh, Dahlia Client, PA-C  predniSONE (DELTASONE) 20 MG tablet Take 2 tablets (40 mg total) by mouth daily. Patient not taking: Reported on 09/02/2015 10/15/14   Emilia Beck, PA-C  predniSONE (DELTASONE) 20 MG tablet Take 2 tablets (40 mg total) by mouth daily. Patient not taking: Reported on 09/02/2015 04/07/15   Francee Piccolo, PA-C    Family History No family history on file.  Social History Social History   Tobacco Use  . Smoking status: Never Smoker  . Smokeless tobacco: Never Used  Substance Use Topics  . Alcohol use: Yes  Alcohol/week: 3.0 standard drinks    Types: 3 Cans of beer per week  . Drug use: Yes    Types: Marijuana     Allergies   Patient has no known allergies.   Review of Systems Review of Systems  Constitutional: Negative for chills and fever.  HENT: Negative for congestion, ear pain and sore throat.   Respiratory: Positive for cough, shortness of breath and wheezing. Negative for chest tightness.   Cardiovascular: Negative for chest pain and leg swelling.    Gastrointestinal: Negative for abdominal pain, diarrhea and vomiting.  Musculoskeletal: Negative for myalgias.  All other systems reviewed and are negative.  Physical Exam Updated Vital Signs BP 124/68 (BP Location: Right Arm)   Pulse 72   Temp 98.4 F (36.9 C) (Oral)   Resp 18   SpO2 100%   Physical Exam Vitals signs and nursing note reviewed.  Constitutional:      General: She is not in acute distress.    Appearance: She is well-developed.  HENT:     Head: Normocephalic and atraumatic.     Right Ear: Tympanic membrane is not perforated, erythematous, retracted or bulging.     Left Ear: Tympanic membrane is not perforated, erythematous, retracted or bulging.     Nose: Nose normal.     Mouth/Throat:     Pharynx: Uvula midline. No oropharyngeal exudate or posterior oropharyngeal erythema.  Eyes:     General:        Right eye: No discharge.        Left eye: No discharge.     Conjunctiva/sclera: Conjunctivae normal.     Pupils: Pupils are equal, round, and reactive to light.  Neck:     Musculoskeletal: Normal range of motion and neck supple.  Cardiovascular:     Rate and Rhythm: Normal rate and regular rhythm.     Heart sounds: No murmur.  Pulmonary:     Effort: Pulmonary effort is normal. No respiratory distress.     Breath sounds: Normal breath sounds. No stridor. No wheezing, rhonchi or rales.  Abdominal:     General: There is no distension.     Palpations: Abdomen is soft.     Tenderness: There is no abdominal tenderness.  Lymphadenopathy:     Cervical: No cervical adenopathy.  Skin:    General: Skin is warm and dry.     Findings: No rash.  Neurological:     Mental Status: She is alert.  Psychiatric:        Behavior: Behavior normal.      ED Treatments / Results  Labs (all labs ordered are listed, but only abnormal results are displayed) Labs Reviewed - No data to display  EKG None  Radiology No results found.  Procedures Procedures (including  critical care time)  Medications Ordered in ED Medications  albuterol (PROVENTIL HFA;VENTOLIN HFA) 108 (90 Base) MCG/ACT inhaler 1-2 puff (2 puffs Inhalation Given 12/01/18 1011)  predniSONE (DELTASONE) tablet 60 mg (60 mg Oral Given 12/01/18 1011)     Initial Impression / Assessment and Plan / ED Course  I have reviewed the triage vital signs and the nursing notes.  Pertinent labs & imaging results that were available during my care of the patient were reviewed by me and considered in my medical decision making (see chart for details).   Patient presents to the ED with intermittent wheezing, dyspnea, and dry cough x 1 week. States this feels similar to prior asthma exacerbations requiring steroids. She just used  last of her albuterol neb PTA and is currently asymptomatic with clear lung sounds. No focal adventitious sound auscultated, afebrile, doubt pneumonia. No areas of decreased or absent breath sounds to suggest pneumothorax. No peripheral edema in young patient to suggest HF related issue. PERC negative doubt PE. She is asymptomatic here, but did just have a neb tx. Her lungs are clear. She does not appear to be in respiratory distress. Vitals WNL. I personally ambulated patient with SpO2 of 100%. We will provide her with an inhaler in the ER and give steroid burst given 1 week of sxs with this feeling similar to prior asthma exacerbations requiring steroids per the patient. I discussed treatment plan, need for follow-up, and return precautions with the patient. Provided opportunity for questions, patient confirmed understanding and is in agreement with plan.    Final Clinical Impressions(s) / ED Diagnoses   Final diagnoses:  Exacerbation of asthma, unspecified asthma severity, unspecified whether persistent    ED Discharge Orders         Ordered    predniSONE (DELTASONE) 50 MG tablet  Daily with breakfast     12/01/18 0957           Lenda Baratta, Pleas KochSamantha R, PA-C 12/01/18  1012    Jacalyn LefevreHaviland, Julie, MD 12/01/18 1015

## 2019-03-14 ENCOUNTER — Emergency Department (HOSPITAL_COMMUNITY)
Admission: EM | Admit: 2019-03-14 | Discharge: 2019-03-14 | Disposition: A | Discharge status: Home / Self Care | Payer: BLUE CROSS/BLUE SHIELD | Attending: Emergency Medicine | Admitting: Emergency Medicine

## 2019-03-14 ENCOUNTER — Encounter (HOSPITAL_COMMUNITY): Payer: Self-pay | Admitting: *Deleted

## 2019-03-14 ENCOUNTER — Other Ambulatory Visit: Payer: Self-pay

## 2019-03-14 DIAGNOSIS — R062 Wheezing: Secondary | ICD-10-CM | POA: Diagnosis present

## 2019-03-14 DIAGNOSIS — J4521 Mild intermittent asthma with (acute) exacerbation: Secondary | ICD-10-CM

## 2019-03-14 DIAGNOSIS — Z79899 Other long term (current) drug therapy: Secondary | ICD-10-CM | POA: Insufficient documentation

## 2019-03-14 MED ORDER — ALBUTEROL SULFATE HFA 108 (90 BASE) MCG/ACT IN AERS: 1.0000 | INHALATION_SPRAY | Freq: Four times a day (QID) | RESPIRATORY_TRACT | Status: DC | PRN

## 2019-03-14 MED ORDER — ALBUTEROL SULFATE HFA 108 (90 BASE) MCG/ACT IN AERS
1.0000 | INHALATION_SPRAY | Freq: Once | RESPIRATORY_TRACT | Status: AC
  Administered 2019-03-14: 1 via RESPIRATORY_TRACT
  Filled 2019-03-14: qty 6.7

## 2019-03-14 NOTE — ED Triage Notes (Signed)
Pt in c/o asthma exhaserbation, states for the last few days she has had a cough and using her inhaler more, denies fever, cough is dry, no distress noted

## 2019-03-14 NOTE — ED Provider Notes (Signed)
[facility] Provider [facility]   CSN: [id number] Arrival date & time: 03/14/19  1354    History   Chief Complaint Chief Complaint  Patient presents with  . Asthma    HPI Melanie Campbell is a 23 y.o. female.     HPI  Patient is a 23 year old female the past medical history of asthma, anxiety, WPW (no longer present for patient) presenting for wheezing and cough.  Patient reports that [facility], however she was only given a one-time treatment and did not have an albuterol inhaler.  Patient reports that she typically needs this when she experiences an exacerbation.  Patient reports that she could feel that she was beginning to wheeze again.  Patient was also given a steroid shot.  Patient denies any significant shortness of breath, productive cough, hemoptysis, lower extremity edema or calf tenderness, estrogen use, recent mobilization, hospitalization, recent surgery, or cancer treatment.  She denies any fever, chills, or upper respiratory symptoms.  No recent exposure to individuals positive for COVID-19 or recent travel. LMP one week ago.  Past Medical History:  Diagnosis Date  . Anxiety   . Asthma   . WPW (Wolff-Parkinson-White syndrome)     There are no active problems to display for this patient.   History reviewed. No pertinent surgical history.   OB History   No obstetric history on file.      Home Medications    Prior to Admission medications   Medication Sig Start Date End Date Taking? Authorizing Provider  hydrocortisone valerate ointment (WESTCORT) 0.2 % Apply 1 application topically 2 (two) times daily. 09/02/15   Deirdre PeerGaddy, Jeremiah, MD  metroNIDAZOLE (FLAGYL) 500 MG tablet Take 1 tablet (500 mg total) by mouth 2 (two) times daily. 09/02/15   Deirdre PeerGaddy, Jeremiah, MD  predniSONE (DELTASONE) 50 MG tablet Take 1 tablet (50 mg total) by mouth daily with breakfast. 12/01/18    Petrucelli, Pleas KochSamantha R, PA-C    Family History History reviewed. No pertinent family history.  Social History Social History   Tobacco Use  . Smoking status: Never Smoker  . Smokeless tobacco: Never Used  Substance Use Topics  . Alcohol use: Yes    Alcohol/week: 3.0 standard drinks    Types: 3 Cans of beer per week  . Drug use: Yes    Types: Marijuana     Allergies   Azithromycin   Review of Systems Review of Systems  Constitutional: Negative for chills and fever.  HENT: Negative for congestion and rhinorrhea.   Respiratory: Positive for cough and wheezing. Negative for shortness of breath.      Physical Exam Updated Vital Signs BP 107/70   Pulse 68   Temp 98 F (36.7 C) (Oral)   Resp 14   SpO2 100%   Physical Exam Vitals signs and nursing note reviewed.  Constitutional:      General: She is not in acute distress.    Appearance: She is well-developed. She is not diaphoretic.     Comments: Sitting comfortably in bed.  HENT:     Head: Normocephalic and atraumatic.  Eyes:     General:        Right eye: No discharge.        Left eye: No discharge.     Conjunctiva/sclera: Conjunctivae normal.     Comments: EOMs normal to gross examination.  Neck:     Musculoskeletal: Normal range of motion.  Cardiovascular:  Rate and Rhythm: Normal rate and regular rhythm.     Pulses: Normal pulses.     Heart sounds: Normal heart sounds.  Pulmonary:     Effort: Pulmonary effort is normal.     Breath sounds: Wheezing present.     Comments: Soft inspiratory wheezes in upper lung fields. Abdominal:     General: There is no distension.  Musculoskeletal: Normal range of motion.  Skin:    General: Skin is warm and dry.  Neurological:     Mental Status: She is alert.     Comments: Cranial nerves intact to gross observation. Patient moves extremities without difficulty.  Psychiatric:        Behavior: Behavior normal.        Thought Content: Thought content normal.         Judgment: Judgment normal.      ED Treatments / Results  Labs (all labs ordered are listed, but only abnormal results are displayed) Labs Reviewed - No data to display  EKG EKG Interpretation  Date/Time:  Wednesday March 14 2019 14:18:35 EDT Ventricular Rate:  73 PR Interval:    QRS Duration: 95 QT Interval:  394 QTC Calculation: 435 R Axis:   49 Text Interpretation:  Unknown rhythm, irregular rate Consider left atrial enlargement Repol abnrm suggests ischemia, diffuse leads No significant change since last tracing Confirmed by Jacalyn Lefevre [phone]) on 03/14/2019 2:27:27 PM   Radiology No results found.  Procedures Procedures (including critical care time)  Medications Ordered in ED Medications  albuterol (VENTOLIN HFA) 108 (90 Base) MCG/ACT inhaler 1 puff (has no administration in time range)     Initial Impression / Assessment and Plan / ED Course  I have reviewed the triage vital signs and the nursing notes.  Pertinent labs & imaging results that were available during my care of the patient were reviewed by me and considered in my medical decision making (see chart for details).        Patient is well-appearing and in no acute distress.  She has normal vital signs.  Patient is 100% oxygen saturation on room air.  Patient reporting that her presentation today is needing to obtain an albuterol inhaler, as she was seen earlier today at [facility].  She is now a primary care provider who is routinely prescribing this currently.  I do not suspect pulmonary embolism given that patient has a well score of 0 and is PERC negative, and reports that her symptoms are identical to prior asthma.  Lung exam with minimal wheezing.  Patient dispensed albuterol inhaler as well as given a refill.  She is given return precautions for any worsening shortness of breath or excessive use of albuterol.  Patient is in understanding and agrees with the plan of care.   Final Clinical Impressions(s) / ED Diagnoses   Final diagnoses:  Mild intermittent asthma with exacerbation    ED Discharge Orders    None       Delia Chimes 03/14/19 1506    Azalia Bilis, MD 03/15/19 [phone]

## 2019-03-14 NOTE — ED Notes (Signed)
Pt normally uses albuterol MDI but is needs a new RX.

## 2019-03-14 NOTE — Discharge Instructions (Signed)
Please read and follow all provided instructions.  Your diagnoses today include:  1. Mild intermittent asthma with exacerbation     Tests performed today include: TESTS Vital signs. See below for your results today.   Medications prescribed:   Take any prescribed medications only as directed.  Home care instructions:  Follow any educational materials contained in this packet.  Follow-up instructions: Please follow-up with your primary care provider in the next 3 days for further evaluation of your symptoms and management of your asthma.  Return instructions:  Please return to the Emergency Department if you experience worsening symptoms. Please return with worsening wheezing, shortness of breath, or difficulty breathing. Return with persistent fever above 101F.  Please return if you have any other emergent concerns.  Additional Information:  Your vital signs today were: BP 107/70    Pulse 68    Temp 98 F (36.7 C) (Oral)    Resp 14    SpO2 100%  If your blood pressure (BP) was elevated above 135/85 this visit, please have this repeated by your doctor within one month. --------------

## 2019-03-14 NOTE — ED Notes (Signed)
Patient Alert and oriented to baseline. Stable and ambulatory to baseline. Patient verbalized understanding of the discharge instructions.  Patient belongings were taken by the patient.   

## 2019-08-25 ENCOUNTER — Emergency Department (HOSPITAL_COMMUNITY): Payer: Self-pay

## 2019-08-25 ENCOUNTER — Other Ambulatory Visit: Payer: Self-pay

## 2019-08-25 ENCOUNTER — Emergency Department (HOSPITAL_COMMUNITY)
Admission: EM | Admit: 2019-08-25 | Discharge: 2019-08-25 | Disposition: A | Discharge status: Home / Self Care | Payer: Self-pay | Attending: Emergency Medicine | Admitting: Emergency Medicine

## 2019-08-25 ENCOUNTER — Encounter (HOSPITAL_COMMUNITY): Payer: Self-pay | Admitting: Emergency Medicine

## 2019-08-25 DIAGNOSIS — M25552 Pain in left hip: Secondary | ICD-10-CM | POA: Diagnosis not present

## 2019-08-25 DIAGNOSIS — M79602 Pain in left arm: Secondary | ICD-10-CM | POA: Insufficient documentation

## 2019-08-25 DIAGNOSIS — S01311A Laceration without foreign body of right ear, initial encounter: Secondary | ICD-10-CM | POA: Insufficient documentation

## 2019-08-25 DIAGNOSIS — J45909 Unspecified asthma, uncomplicated: Secondary | ICD-10-CM | POA: Insufficient documentation

## 2019-08-25 DIAGNOSIS — S0990XA Unspecified injury of head, initial encounter: Secondary | ICD-10-CM | POA: Insufficient documentation

## 2019-08-25 DIAGNOSIS — Y999 Unspecified external cause status: Secondary | ICD-10-CM | POA: Insufficient documentation

## 2019-08-25 DIAGNOSIS — R55 Syncope and collapse: Secondary | ICD-10-CM | POA: Diagnosis not present

## 2019-08-25 DIAGNOSIS — Y9389 Activity, other specified: Secondary | ICD-10-CM | POA: Diagnosis not present

## 2019-08-25 DIAGNOSIS — R0789 Other chest pain: Secondary | ICD-10-CM | POA: Diagnosis not present

## 2019-08-25 DIAGNOSIS — Y92410 Unspecified street and highway as the place of occurrence of the external cause: Secondary | ICD-10-CM | POA: Insufficient documentation

## 2019-08-25 DIAGNOSIS — M7918 Myalgia, other site: Secondary | ICD-10-CM

## 2019-08-25 DIAGNOSIS — S09301A Unspecified injury of right middle and inner ear, initial encounter: Secondary | ICD-10-CM | POA: Diagnosis present

## 2019-08-25 DIAGNOSIS — S3991XA Unspecified injury of abdomen, initial encounter: Secondary | ICD-10-CM | POA: Diagnosis not present

## 2019-08-25 DIAGNOSIS — M79605 Pain in left leg: Secondary | ICD-10-CM | POA: Diagnosis not present

## 2019-08-25 LAB — CBC
HCT: 41.2 % (ref 36.0–46.0)
Hemoglobin: 13.4 g/dL (ref 12.0–15.0)
MCH: 27.8 pg (ref 26.0–34.0)
MCHC: 32.5 g/dL (ref 30.0–36.0)
MCV: 85.5 fL (ref 80.0–100.0)
Platelets: 212 10*3/uL (ref 150–400)
RBC: 4.82 MIL/uL (ref 3.87–5.11)
RDW: 14.4 % (ref 11.5–15.5)
WBC: 9.7 10*3/uL (ref 4.0–10.5)
nRBC: 0 % (ref 0.0–0.2)

## 2019-08-25 LAB — SAMPLE TO BLOOD BANK

## 2019-08-25 LAB — COMPREHENSIVE METABOLIC PANEL
ALT: 18 U/L (ref 0–44)
AST: 28 U/L (ref 15–41)
Albumin: 4.1 g/dL (ref 3.5–5.0)
Alkaline Phosphatase: 44 U/L (ref 38–126)
Anion gap: 10 (ref 5–15)
BUN: 8 mg/dL (ref 6–20)
CO2: 20 mmol/L — ABNORMAL LOW (ref 22–32)
Calcium: 9.1 mg/dL (ref 8.9–10.3)
Chloride: 109 mmol/L (ref 98–111)
Creatinine, Ser: 1.13 mg/dL — ABNORMAL HIGH (ref 0.44–1.00)
GFR calc Af Amer: >60 mL/min (ref >60)
GFR calc non Af Amer: >60 mL/min (ref >60)
Glucose, Bld: 114 mg/dL — ABNORMAL HIGH (ref 70–99)
Potassium: 3.6 mmol/L (ref 3.5–5.1)
Sodium: 139 mmol/L (ref 135–145)
Total Bilirubin: 0.2 mg/dL — ABNORMAL LOW (ref 0.3–1.2)
Total Protein: 6.8 g/dL (ref 6.5–8.1)

## 2019-08-25 LAB — CDS SEROLOGY

## 2019-08-25 LAB — I-STAT CHEM 8, ED
BUN: 8 mg/dL (ref 6–20)
Calcium, Ion: 1.11 mmol/L — ABNORMAL LOW (ref 1.15–1.40)
Chloride: 109 mmol/L (ref 98–111)
Creatinine, Ser: 1.2 mg/dL — ABNORMAL HIGH (ref 0.44–1.00)
Glucose, Bld: 108 mg/dL — ABNORMAL HIGH (ref 70–99)
HCT: 44 % (ref 36.0–46.0)
Hemoglobin: 15 g/dL (ref 12.0–15.0)
Potassium: 3.5 mmol/L (ref 3.5–5.1)
Sodium: 141 mmol/L (ref 135–145)
TCO2: 18 mmol/L — ABNORMAL LOW (ref 22–32)

## 2019-08-25 LAB — PROTIME-INR
INR: 1 (ref 0.8–1.2)
Prothrombin Time: 13.5 seconds (ref 11.4–15.2)

## 2019-08-25 LAB — ETHANOL: Alcohol, Ethyl (B): 90 mg/dL — ABNORMAL HIGH (ref <10)

## 2019-08-25 LAB — LACTIC ACID, PLASMA: Lactic Acid, Venous: 3 mmol/L (ref 0.5–1.9)

## 2019-08-25 LAB — I-STAT BETA HCG BLOOD, ED (MC, WL, AP ONLY): I-stat hCG, quantitative: <5 m[IU]/mL (ref <5)

## 2019-08-25 MED ORDER — IOHEXOL 300 MG/ML  SOLN
100.0000 mL | Freq: Once | INTRAMUSCULAR | Status: AC | PRN
  Administered 2019-08-25: 100 mL via INTRAVENOUS

## 2019-08-25 MED ORDER — FENTANYL CITRATE (PF) 100 MCG/2ML IJ SOLN
100.0000 ug | Freq: Once | INTRAMUSCULAR | Status: AC
  Administered 2019-08-25: 05:00:00 100 ug via INTRAVENOUS
  Filled 2019-08-25: qty 2

## 2019-08-25 MED ORDER — CYCLOBENZAPRINE HCL 10 MG PO TABS: 10.0000 mg | ORAL_TABLET | Freq: Three times a day (TID) | ORAL | 0 refills | Status: DC | PRN

## 2019-08-25 MED ORDER — HYDROCODONE-ACETAMINOPHEN 5-325 MG PO TABS: 1.0000 | ORAL_TABLET | ORAL | 0 refills | Status: DC | PRN

## 2019-08-25 NOTE — ED Notes (Signed)
Patient's mother at bedside.

## 2019-08-25 NOTE — ED Notes (Signed)
Patient transported to X-ray 

## 2019-08-25 NOTE — ED Notes (Signed)
Patient asking where her pants and boots are, this RN advised patient that no belongings were brought in with her when she arrived via ems.

## 2019-08-25 NOTE — ED Notes (Signed)
Patient assisted to call her mother.

## 2019-08-25 NOTE — ED Notes (Signed)
Date and time results received: 08/25/19 "6:34 AM   Test: Lactic Acid Critical Value: 3.0  Name of Provider Notified: Betsey Holiday, MD  Orders Received? Or Actions Taken?: None at this time
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
## 2019-08-25 NOTE — ED Notes (Signed)
Patient transported to CT 

## 2019-08-25 NOTE — ED Notes (Signed)
Pt not in room for lab draws will return when she comes back from xray
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
## 2019-11-11 ENCOUNTER — Emergency Department (HOSPITAL_BASED_OUTPATIENT_CLINIC_OR_DEPARTMENT_OTHER)
Admission: EM | Admit: 2019-11-11 | Discharge: 2019-11-11 | Disposition: A | Discharge status: Home / Self Care | Payer: Medicaid Other | Attending: Emergency Medicine | Admitting: Emergency Medicine

## 2019-11-11 ENCOUNTER — Other Ambulatory Visit: Payer: Self-pay

## 2019-11-11 ENCOUNTER — Encounter (HOSPITAL_BASED_OUTPATIENT_CLINIC_OR_DEPARTMENT_OTHER): Payer: Self-pay

## 2019-11-11 DIAGNOSIS — R05 Cough: Secondary | ICD-10-CM | POA: Insufficient documentation

## 2019-11-11 DIAGNOSIS — J45901 Unspecified asthma with (acute) exacerbation: Secondary | ICD-10-CM

## 2019-11-11 MED ORDER — PREDNISONE 50 MG PO TABS: 50.0000 mg | ORAL_TABLET | Freq: Every day | ORAL | 0 refills | Status: AC

## 2019-11-11 MED ORDER — ALBUTEROL SULFATE HFA 108 (90 BASE) MCG/ACT IN AERS
INHALATION_SPRAY | RESPIRATORY_TRACT | Status: AC
  Administered 2019-11-11: 15:00:00 4 via RESPIRATORY_TRACT
  Filled 2019-11-11: qty 6.7

## 2019-11-11 MED ORDER — FLUTICASONE-SALMETEROL 250-50 MCG/DOSE IN AEPB: 1.0000 | INHALATION_SPRAY | Freq: Two times a day (BID) | RESPIRATORY_TRACT | 0 refills | Status: DC

## 2019-11-11 MED ORDER — ALBUTEROL SULFATE HFA 108 (90 BASE) MCG/ACT IN AERS: 4.0000 | INHALATION_SPRAY | Freq: Once | RESPIRATORY_TRACT | Status: AC

## 2019-11-11 MED ORDER — IPRATROPIUM BROMIDE HFA 17 MCG/ACT IN AERS: 2.0000 | INHALATION_SPRAY | Freq: Once | RESPIRATORY_TRACT | Status: AC

## 2019-11-11 MED ORDER — IPRATROPIUM BROMIDE HFA 17 MCG/ACT IN AERS
INHALATION_SPRAY | RESPIRATORY_TRACT | Status: AC
  Administered 2019-11-11: 2 via RESPIRATORY_TRACT
  Filled 2019-11-11: qty 12.9

## 2019-11-11 NOTE — ED Provider Notes (Signed)
[facility] EMERGENCY DEPARTMENT Provider Note   CSN: [id number] Arrival date & time: 11/11/19  1511     History Chief Complaint  Patient presents with  . Asthma    Melanie Campbell is a 23 y.o. female.  HPI   23 year old female with history of anxiety, asthma, WPW, who presents emergency department today complaining of shortness of breath that started yesterday.  States that she started having shortness of breath, cough, wheezing.  Symptoms consistent with prior asthma flares.  States that when it is cold outside she typically gets flares.  She ran out of her albuterol inhaler today so she is here for a refill.  She does not currently have a PCP as she lost her insurance after being laid off in June.  States she was on a controller inhaler in the past which helped control her symptoms a lot however she has not been on that in a very long time due to problems and following up with PCP. Denies known COVID exposures.  Past Medical History:  Diagnosis Date  . Anxiety   . Asthma   . WPW (Wolff-Parkinson-White syndrome)     There are no problems to display for this patient.   History reviewed. No pertinent surgical history.   OB History   No obstetric history on file.     No family history on file.  Social History   Tobacco Use  . Smoking status: Never Smoker  . Smokeless tobacco: Never Used  Substance Use Topics  . Alcohol use: Yes    Alcohol/week: 3.0 standard drinks    Types: 3 Cans of beer per week  . Drug use: Not Currently    Types: Marijuana    Home Medications Prior to Admission medications   Medication Sig Start Date End Date Taking? Authorizing Provider  albuterol (VENTOLIN HFA) 108 (90 Base) MCG/ACT inhaler Inhale 2 puffs into the lungs every 4 (four) hours as needed for wheezing.  06/30/17   [provider]  cyclobenzaprine (FLEXERIL) 10 MG tablet Take 1 tablet (10 mg total) by mouth 3 (three) times daily as needed for muscle spasms.  08/25/19   Gilda CreasePollina, Christopher J, MD  Fluticasone-Salmeterol (ADVAIR DISKUS) 250-50 MCG/DOSE AEPB Inhale 1 puff into the lungs every 12 (twelve) hours. 11/11/19   Trevon Strothers S, PA-C  HYDROcodone-acetaminophen (NORCO/VICODIN) 5-325 MG tablet Take 1-2 tablets by mouth every 4 (four) hours as needed for moderate pain. 08/25/19   Gilda CreasePollina, Christopher J, MD  predniSONE (DELTASONE) 50 MG tablet Take 1 tablet (50 mg total) by mouth daily for 5 days. 11/11/19 11/16/19  Floyed Masoud S, PA-C    Allergies    Azithromycin  Review of Systems   Review of Systems  Constitutional: Negative for fever.  HENT: Negative for ear pain and sore throat.   Eyes: Negative for pain and visual disturbance.  Respiratory: Positive for cough, shortness of breath and wheezing.   Cardiovascular: Negative for chest pain and palpitations.  Gastrointestinal: Negative for abdominal pain, constipation, diarrhea, nausea and vomiting.  Genitourinary: Negative for dysuria and hematuria.  Musculoskeletal: Negative for back pain.  Skin: Negative for rash.  Neurological: Negative for headaches.  All other systems reviewed and are negative.   Physical Exam Updated Vital Signs BP 125/72 (BP Location: Right Arm)   Pulse 73 Comment: Simultaneous filing. User may not have seen previous data.  Temp 98.5 F (36.9 C) (Oral)   Resp 20   Ht 5\' 2"  (1.575 m)   Wt 81.6 kg  LMP 11/08/2019   SpO2 97% Comment: Simultaneous filing. User may not have seen previous data.  BMI 32.92 kg/m   Physical Exam Vitals and nursing note reviewed.  Constitutional:      General: She is not in acute distress.    Appearance: She is well-developed.  HENT:     Head: Normocephalic and atraumatic.  Eyes:     Conjunctiva/sclera: Conjunctivae normal.  Cardiovascular:     Rate and Rhythm: Normal rate and regular rhythm.     Heart sounds: Normal heart sounds. No murmur.  Pulmonary:     Effort: Pulmonary effort is normal. No respiratory  distress.     Breath sounds: Wheezing (rare end expiratory wheezes) present. No rhonchi or rales.  Abdominal:     General: Bowel sounds are normal.     Palpations: Abdomen is soft.     Tenderness: There is no abdominal tenderness. There is no guarding or rebound.  Musculoskeletal:     Cervical back: Neck supple.  Skin:    General: Skin is warm and dry.  Neurological:     Mental Status: She is alert.     ED Results / Procedures / Treatments   Labs (all labs ordered are listed, but only abnormal results are displayed) Labs Reviewed - No data to display  EKG None  Radiology No results found.  Procedures Procedures (including critical care time)  Medications Ordered in ED Medications  albuterol (VENTOLIN HFA) 108 (90 Base) MCG/ACT inhaler 4 puff (4 puffs Inhalation Given 11/11/19 1524)  ipratropium (ATROVENT HFA) inhaler 2 puff (2 puffs Inhalation Given 11/11/19 1524)    ED Course  I have reviewed the triage vital signs and the nursing notes.  Pertinent labs & imaging results that were available during my care of the patient were reviewed by me and considered in my medical decision making (see chart for details).    MDM Rules/Calculators/A&P                      23 year old female presenting for evaluation of asthma exacerbation.  Started having asthma exacerbation yesterday due to the cold weather.  Has been using her home inhaler but ran out today.  She has been on a controller inhaler in the past but has had issues following with a PCP therefore is not currently on 1 at this time.  She was noted to have wheezing prior to my examination and was administered albuterol by respiratory therapy.  On my evaluation she does has rare end expiratory wheezes but states she feels back to baseline with her breathing and appears to be comfortable.  She is not hypoxic or tachypneic.  I will refill her albuterol inhaler and we will also refill her controller inhaler.  She will be given  prednisone and I advised to resume her home regimen of controller inhaler prior to using the prednisone.  If she is still having significant wheezing and symptoms then she can take the prednisone.  I will also give her information to follow-up with [facility] so she can establish with a PCP.  Discussed possibility of coronavirus infection.  She denies any exposures and feels that her symptoms are more consistent with asthma exacerbation.  I offered testing however she declined.  Advised her to return to ED for new or worsening symptoms.  She voices understanding of plan and reasons to return.  All questions answered.  Patient stable for discharge.  Final Clinical Impression(s) / ED Diagnoses Final  diagnoses:  Exacerbation of asthma, unspecified asthma severity, unspecified whether persistent    Rx / DC Orders ED Discharge Orders         Ordered    Fluticasone-Salmeterol (ADVAIR DISKUS) 250-50 MCG/DOSE AEPB  Every 12 hours     11/11/19 1600    predniSONE (DELTASONE) 50 MG tablet  Daily     11/11/19 1600           Rodney Booze, PA-C 11/11/19 1602    Wyvonnia Dusky, MD 11/11/19 2222

## 2019-11-11 NOTE — ED Notes (Signed)
ED Provider at bedside. 

## 2019-11-11 NOTE — ED Notes (Addendum)
Pt has been evaluated by RT and received inhaler. States breathing improved at present. States she is out of her meds

## 2019-11-11 NOTE — Discharge Instructions (Signed)
Please use 1 puff of the Advair discus 2 times daily 12 hours apart.  If you are continuing to have shortness of breath you may use 2 puffs of the albuterol inhaler every 4-6 hours as needed.  Please resume this medication regimen.  If you are still having wheezing and shortness of breath over the next 3 days then you may start the prednisone.  Please follow-up with the [facility] that was provided for you in your discharge paperwork.  If you have any new or worsening symptoms in the meantime please return to the emergency department for any new or worsening symptoms.

## 2019-11-11 NOTE — ED Triage Notes (Signed)
Pt has a hx of asthma. Pt been c/o asthma attack since yesterday. Pt having cough and ShOB. Pt states she is out of asthma medication and does not have the means to get it refilled. Lung sounds tight with expiratory wheezes noted in triage.

## 2019-11-11 NOTE — ED Notes (Signed)
Pt reports significant improvement in breathing at time of discharge.

## 2020-01-18 ENCOUNTER — Other Ambulatory Visit: Payer: Self-pay

## 2020-01-18 ENCOUNTER — Emergency Department (HOSPITAL_BASED_OUTPATIENT_CLINIC_OR_DEPARTMENT_OTHER)
Admission: EM | Admit: 2020-01-18 | Discharge: 2020-01-18 | Disposition: A | Discharge status: Home / Self Care | Payer: Medicaid Other | Attending: Emergency Medicine | Admitting: Emergency Medicine

## 2020-01-18 ENCOUNTER — Encounter (HOSPITAL_BASED_OUTPATIENT_CLINIC_OR_DEPARTMENT_OTHER): Payer: Self-pay

## 2020-01-18 DIAGNOSIS — J45909 Unspecified asthma, uncomplicated: Secondary | ICD-10-CM | POA: Insufficient documentation

## 2020-01-18 DIAGNOSIS — Z881 Allergy status to other antibiotic agents status: Secondary | ICD-10-CM | POA: Insufficient documentation

## 2020-01-18 DIAGNOSIS — Z79899 Other long term (current) drug therapy: Secondary | ICD-10-CM | POA: Insufficient documentation

## 2020-01-18 DIAGNOSIS — N760 Acute vaginitis: Secondary | ICD-10-CM | POA: Insufficient documentation

## 2020-01-18 DIAGNOSIS — B9689 Other specified bacterial agents as the cause of diseases classified elsewhere: Secondary | ICD-10-CM

## 2020-01-18 LAB — URINALYSIS, ROUTINE W REFLEX MICROSCOPIC
Bilirubin Urine: NEGATIVE
Glucose, UA: NEGATIVE mg/dL
Ketones, ur: NEGATIVE mg/dL
Leukocytes,Ua: NEGATIVE
Nitrite: NEGATIVE
Protein, ur: NEGATIVE mg/dL
Specific Gravity, Urine: 1.015 (ref 1.005–1.030)
pH: 7 (ref 5.0–8.0)

## 2020-01-18 LAB — URINALYSIS, MICROSCOPIC (REFLEX): WBC, UA: NONE SEEN WBC/hpf (ref 0–5)

## 2020-01-18 LAB — WET PREP, GENITAL
Sperm: NONE SEEN
Trich, Wet Prep: NONE SEEN
Yeast Wet Prep HPF POC: NONE SEEN

## 2020-01-18 LAB — PREGNANCY, URINE: Preg Test, Ur: NEGATIVE

## 2020-01-18 LAB — HIV ANTIBODY (ROUTINE TESTING W REFLEX): HIV Screen 4th Generation wRfx: NONREACTIVE

## 2020-01-18 MED ORDER — METRONIDAZOLE 500 MG PO TABS: 500.0000 mg | ORAL_TABLET | Freq: Two times a day (BID) | ORAL | 0 refills | Status: DC

## 2020-01-18 MED FILL — METRONIDAZOLE 500 MG TABS: 500 | 7 days supply | Qty: 14 | Fill #0

## 2020-01-18 NOTE — ED Triage Notes (Signed)
Pt arrives ambulatory to ED with c/o vaginal itching, discharge and odor X3-4 days.

## 2020-01-18 NOTE — ED Notes (Signed)
ED Provider at bedside. 

## 2020-01-18 NOTE — Discharge Instructions (Signed)
You were seen in the emergency department today for vaginal discharge.  Your labs show that you have bacterial vaginosis, this is not a sexually transmitted infection, please see attached handout for details.  We are treating this with Flagyl, an antibiotic, please take this as prescribed.  Do not drink alcohol when taking Flagyl as it can be extremely dangerous.  We have prescribed you new medication(s) today. Discuss the medications prescribed today with your pharmacist as they can have adverse effects and interactions with your other medicines including over the counter and prescribed medications. Seek medical evaluation if you start to experience new or abnormal symptoms after taking one of these medicines, seek care immediately if you start to experience difficulty breathing, feeling of your throat closing, facial swelling, or rash as these could be indications of a more serious allergic reaction  We have tested you for gonorrhea, chlamydia, HIV, and syphilis, we will call you if any of these results are positive, you may also review results on MyChart.  If positive you will need to inform all sexual partners and be treated.  Please follow-up with your primary care provider and/or women's health within 1 week for reevaluation.  Return to the emergency department for new or worsening symptoms including but not limited to pelvic pain, fever, inability to keep fluids down, or any other concerns.

## 2020-01-18 NOTE — ED Provider Notes (Signed)
[facility] EMERGENCY DEPARTMENT Provider Note   CSN: [id number] Arrival date & time: 01/18/20  1318     History Chief Complaint  Patient presents with  . Vaginal Discharge    Melanie Campbell is a 24 y.o. female with a history of anxiety, asthma, & WPW who presents to the ED with complaints of vaginal discharge for the past few days. Patient states discharge is yellow, pruritic, and malodorous. No alleviating/aggravating factors. No intervention PTA. Reports she started somewhat of her cycle today. Sexually active with toys with one female partner, no known STD exposures. Denies fever, chills, nausea/vomiting, pelvic pain, or dysuria.   HPI     Past Medical History:  Diagnosis Date  . Anxiety   . Asthma   . WPW (Wolff-Parkinson-White syndrome)     There are no problems to display for this patient.   History reviewed. No pertinent surgical history.   OB History   No obstetric history on file.     No family history on file.  Social History   Tobacco Use  . Smoking status: Never Smoker  . Smokeless tobacco: Never Used  Substance Use Topics  . Alcohol use: Yes    Alcohol/week: 3.0 standard drinks    Types: 3 Cans of beer per week  . Drug use: Yes    Types: Marijuana    Home Medications Prior to Admission medications   Medication Sig Start Date End Date Taking? Authorizing Provider  albuterol (VENTOLIN HFA) 108 (90 Base) MCG/ACT inhaler Inhale 2 puffs into the lungs every 4 (four) hours as needed for wheezing.  06/30/17   [provider]  cyclobenzaprine (FLEXERIL) 10 MG tablet Take 1 tablet (10 mg total) by mouth 3 (three) times daily as needed for muscle spasms. 08/25/19   Gilda Crease, MD  Fluticasone-Salmeterol (ADVAIR DISKUS) 250-50 MCG/DOSE AEPB Inhale 1 puff into the lungs every 12 (twelve) hours. 11/11/19   Couture, Cortni S, PA-C  HYDROcodone-acetaminophen (NORCO/VICODIN) 5-325 MG tablet Take 1-2 tablets by mouth every 4 (four)  hours as needed for moderate pain. 08/25/19   Gilda Crease, MD    Allergies    Azithromycin  Review of Systems   Review of Systems  Constitutional: Negative for chills and fever.  Respiratory: Negative for shortness of breath.   Cardiovascular: Negative for chest pain.  Gastrointestinal: Negative for nausea and vomiting.  Genitourinary: Positive for vaginal bleeding and vaginal discharge. Negative for dysuria and pelvic pain.  Skin: Negative for wound.  All other systems reviewed and are negative.   Physical Exam Updated Vital Signs BP 126/81 (BP Location: Right Arm)   Pulse 88   Temp 98.8 F (37.1 C) (Oral)   Resp 18   Ht 5\' 2"  (1.575 m)   Wt 79.4 kg   LMP 12/24/2019   SpO2 96%   BMI 32.01 kg/m   Physical Exam Vitals and nursing note reviewed. Exam conducted with a chaperone present.  Constitutional:      General: She is not in acute distress.    Appearance: She is well-developed. She is not toxic-appearing.  HENT:     Head: Normocephalic and atraumatic.  Eyes:     General:        Right eye: No discharge.        Left eye: No discharge.     Conjunctiva/sclera: Conjunctivae normal.  Cardiovascular:     Rate and Rhythm: Normal rate and regular rhythm.  Pulmonary:     Effort: Pulmonary effort is  normal. No respiratory distress.     Breath sounds: Normal breath sounds. No wheezing, rhonchi or rales.  Abdominal:     General: There is no distension.     Palpations: Abdomen is soft.     Tenderness: There is no abdominal tenderness. There is no guarding or rebound.  Genitourinary:    Labia:        Right: No lesion.        Left: No lesion.      Vagina: Vaginal discharge (thin white) and bleeding (mild blood present in vault) present.     Cervix: No cervical motion tenderness.     Adnexa:        Right: No tenderness or fullness.         Left: No tenderness or fullness.    Musculoskeletal:     Cervical back: Neck supple.  Skin:    General: Skin is warm  and dry.     Findings: No rash.  Neurological:     Mental Status: She is alert.     Comments: Clear speech.   Psychiatric:        Behavior: Behavior normal.     ED Results / Procedures / Treatments   Labs (all labs ordered are listed, but only abnormal results are displayed) Labs Reviewed - No data to display  EKG None  Radiology No results found.  Procedures Procedures (including critical care time)  Medications Ordered in ED Medications - No data to display  ED Course  I have reviewed the triage vital signs and the nursing notes.  Pertinent labs & imaging results that were available during my care of the patient were reviewed by me and considered in my medical decision making (see chart for details).    MDM Rules/Calculators/A&P                      Patient presents to the ED with complaints of vaginal discharge over past few days. Nontoxic, vitals WNL. No abdominal, cervical motion , or adnexal tenderness to indicate PID. Preg test negative. UA w/o obvious infection, trace hgb likely related to vaginal bleeding. Wet prep w/ BV- will treat with flagyl. STI tests pending, defer tx to results, patient aware of pending results & need to seek tx & inform all sexual partners if positive. I discussed results, treatment plan, need for follow-up, and return precautions with the patient. Provided opportunity for questions, patient confirmed understanding and is in agreement with plan.   Final Clinical Impression(s) / ED Diagnoses Final diagnoses:  BV (bacterial vaginosis)    Rx / DC Orders ED Discharge Orders         Ordered    metroNIDAZOLE (FLAGYL) 500 MG tablet  2 times daily     01/18/20 [address] [place] R, PA-C 01/18/20 1508    Lennice Sites, DO 01/18/20 1516

## 2020-01-19 LAB — RPR: RPR Ser Ql: NONREACTIVE

## 2020-01-22 LAB — GC/CHLAMYDIA PROBE AMP (~~LOC~~) NOT AT ARMC
Chlamydia: NEGATIVE
Neisseria Gonorrhea: NEGATIVE

## 2020-03-31 ENCOUNTER — Encounter (HOSPITAL_BASED_OUTPATIENT_CLINIC_OR_DEPARTMENT_OTHER): Payer: Self-pay | Admitting: Emergency Medicine

## 2020-03-31 ENCOUNTER — Emergency Department (HOSPITAL_BASED_OUTPATIENT_CLINIC_OR_DEPARTMENT_OTHER)
Admission: EM | Admit: 2020-03-31 | Discharge: 2020-03-31 | Disposition: A | Discharge status: Home / Self Care | Payer: Medicaid Other | Attending: Emergency Medicine | Admitting: Emergency Medicine

## 2020-03-31 ENCOUNTER — Other Ambulatory Visit: Payer: Self-pay

## 2020-03-31 DIAGNOSIS — J4520 Mild intermittent asthma, uncomplicated: Secondary | ICD-10-CM | POA: Insufficient documentation

## 2020-03-31 DIAGNOSIS — I456 Pre-excitation syndrome: Secondary | ICD-10-CM | POA: Insufficient documentation

## 2020-03-31 DIAGNOSIS — Z79899 Other long term (current) drug therapy: Secondary | ICD-10-CM | POA: Insufficient documentation

## 2020-03-31 MED ORDER — ALBUTEROL SULFATE HFA 108 (90 BASE) MCG/ACT IN AERS
2.0000 | INHALATION_SPRAY | RESPIRATORY_TRACT | Status: DC
  Administered 2020-03-31: 2 via RESPIRATORY_TRACT
  Filled 2020-03-31: qty 6.7

## 2020-03-31 MED ORDER — CETIRIZINE HCL 10 MG PO TABS: 10.0000 mg | ORAL_TABLET | Freq: Every day | ORAL | 1 refills | Status: DC

## 2020-03-31 MED ORDER — FLUTICASONE-SALMETEROL 250-50 MCG/DOSE IN AEPB: 1.0000 | INHALATION_SPRAY | Freq: Two times a day (BID) | RESPIRATORY_TRACT | 1 refills | Status: DC

## 2020-03-31 NOTE — ED Provider Notes (Signed)
[facility] EMERGENCY DEPARTMENT Provider Note   CSN: [id number] Arrival date & time: 03/31/20  0645     History Chief Complaint  Patient presents with  . Asthma    Melanie Campbell is a 24 y.o. female.  24yo F w/ h/o asthma and WPW who p/w wheezing.  Patient reports that for the past few days she has had problems with wheezing and asthma.  Very slight cough but no fevers, vomiting, diarrhea, or sick contacts.  Feels like usual asthma.  She has been out of her medications for a while due to insurance changes.  Tried to work today and was sent home.  Feels improved after receiving albuterol here.  Does report problems with seasonal allergies including eye itching and swelling; took Benadryl last week.  The history is provided by the patient.  Asthma       Past Medical History:  Diagnosis Date  . Anxiety   . Asthma   . WPW (Wolff-Parkinson-White syndrome)     There are no problems to display for this patient.   History reviewed. No pertinent surgical history.   OB History   No obstetric history on file.     No family history on file.  Social History   Tobacco Use  . Smoking status: Never Smoker  . Smokeless tobacco: Never Used  Substance Use Topics  . Alcohol use: Not Currently    Alcohol/week: 3.0 standard drinks    Types: 3 Cans of beer per week  . Drug use: Yes    Types: Marijuana    Home Medications Prior to Admission medications   Medication Sig Start Date End Date Taking? Authorizing Provider  albuterol (VENTOLIN HFA) 108 (90 Base) MCG/ACT inhaler Inhale 2 puffs into the lungs every 4 (four) hours as needed for wheezing.  06/30/17   [provider]  cetirizine (ZYRTEC ALLERGY) 10 MG tablet Take 1 tablet (10 mg total) by mouth daily. 03/31/20   Darnel Mchan, Ambrose Finland, MD  Fluticasone-Salmeterol (ADVAIR DISKUS) 250-50 MCG/DOSE AEPB Inhale 1 puff into the lungs every 12 (twelve) hours. 03/31/20   Kimon Loewen, Ambrose Finland, MD    Allergies      Azithromycin  Review of Systems   Review of Systems All other systems reviewed and are negative except that which was mentioned in HPI  Physical Exam Updated Vital Signs BP (!) 111/93 (BP Location: Right Arm)   Pulse 69   Temp 97.9 F (36.6 C) (Oral)   Resp 18   Ht 5\' 1"  (1.549 m)   Wt 81.6 kg   LMP 03/21/2020 (Approximate)   SpO2 98%   BMI 34.01 kg/m   Physical Exam Vitals and nursing note reviewed.  Constitutional:      General: She is not in acute distress.    Appearance: She is well-developed.  HENT:     Head: Normocephalic and atraumatic.  Eyes:     Conjunctiva/sclera: Conjunctivae normal.  Cardiovascular:     Rate and Rhythm: Normal rate and regular rhythm.     Heart sounds: Normal heart sounds. No murmur.  Pulmonary:     Effort: Pulmonary effort is normal.     Breath sounds: Normal breath sounds. No wheezing.  Abdominal:     General: Bowel sounds are normal. There is no distension.     Palpations: Abdomen is soft.     Tenderness: There is no abdominal tenderness.  Musculoskeletal:     Cervical back: Neck supple.  Skin:    General: Skin is warm  and dry.  Neurological:     Mental Status: She is alert and oriented to person, place, and time.     Comments: Fluent speech  Psychiatric:        Judgment: Judgment normal.     ED Results / Procedures / Treatments   Labs (all labs ordered are listed, but only abnormal results are displayed) Labs Reviewed - No data to display  EKG None  Radiology No results found.  Procedures Procedures (including critical care time)  Medications Ordered in ED Medications  albuterol (VENTOLIN HFA) 108 (90 Base) MCG/ACT inhaler 2 puff (2 puffs Inhalation Given 03/31/20 1572)    ED Course  I have reviewed the triage vital signs and the nursing notes.    MDM Rules/Calculators/A&P                      Pt had already received albuterol prior to my eval. Clear lung sounds, normal WOB, no wheezing. Sats 100% andHR  60s-70. Recommended restarting her previous inhaled corticosteroid along w/ daily allergy med. Has inhaler for home use prn. Given no wheezing currently, I do not feel she needs steroid burst. Discussed return precautions. Final Clinical Impression(s) / ED Diagnoses Final diagnoses:  Mild intermittent asthma without complication    Rx / DC Orders ED Discharge Orders         Ordered    Fluticasone-Salmeterol (ADVAIR DISKUS) 250-50 MCG/DOSE AEPB  Every 12 hours     03/31/20 0723    cetirizine (ZYRTEC ALLERGY) 10 MG tablet  Daily     03/31/20 0723           Kaysen Deal, Wenda Overland, MD 03/31/20 [phone]

## 2020-03-31 NOTE — ED Triage Notes (Signed)
Pt reports asthma exacerbation since Saturday. States tried to go to work and was sent home. Denies fever, slight cough with phlegm.

## 2020-04-22 ENCOUNTER — Other Ambulatory Visit: Payer: Self-pay

## 2020-04-22 ENCOUNTER — Encounter (HOSPITAL_BASED_OUTPATIENT_CLINIC_OR_DEPARTMENT_OTHER): Payer: Self-pay | Admitting: Emergency Medicine

## 2020-04-22 ENCOUNTER — Emergency Department (HOSPITAL_BASED_OUTPATIENT_CLINIC_OR_DEPARTMENT_OTHER)
Admission: EM | Admit: 2020-04-22 | Discharge: 2020-04-22 | Disposition: A | Discharge status: Home / Self Care | Payer: Medicaid Other | Attending: Emergency Medicine | Admitting: Emergency Medicine

## 2020-04-22 DIAGNOSIS — Z79899 Other long term (current) drug therapy: Secondary | ICD-10-CM | POA: Insufficient documentation

## 2020-04-22 DIAGNOSIS — J4520 Mild intermittent asthma, uncomplicated: Secondary | ICD-10-CM

## 2020-04-22 MED ORDER — ALBUTEROL SULFATE HFA 108 (90 BASE) MCG/ACT IN AERS
INHALATION_SPRAY | RESPIRATORY_TRACT | Status: AC
  Administered 2020-04-22: 2
  Filled 2020-04-22: qty 6.7

## 2020-04-22 MED ORDER — PREDNISONE 20 MG PO TABS: 40.0000 mg | ORAL_TABLET | Freq: Every day | ORAL | 0 refills | Status: AC

## 2020-04-22 MED FILL — predniSONE 20 MG TABS: 20 | 5 days supply | Qty: 10 | Fill #0

## 2020-04-22 NOTE — ED Provider Notes (Signed)
[facility] EMERGENCY DEPARTMENT Provider Note   CSN: [id number] Arrival date & time: 04/22/20  0701     History Chief Complaint  Patient presents with  . Asthma    Melanie Campbell is a 24 y.o. female.  The history is provided by the patient.  Shortness of Breath Severity:  Mild Onset quality:  Gradual Timing:  Intermittent Chronicity:  New Context comment:  Asthma type symptoms and inhaler broke and needs new one. No fever, no sputum production. Relieved by:  Nothing Worsened by:  Nothing Associated symptoms: wheezing   Associated symptoms: no abdominal pain, no chest pain, no claudication, no cough, no ear pain, no fever, no rash, no sore throat and no vomiting        Past Medical History:  Diagnosis Date  . Anxiety   . Asthma   . WPW (Wolff-Parkinson-White syndrome)     There are no problems to display for this patient.   History reviewed. No pertinent surgical history.   OB History   No obstetric history on file.     History reviewed. No pertinent family history.  Social History   Tobacco Use  . Smoking status: Never Smoker  . Smokeless tobacco: Never Used  Substance Use Topics  . Alcohol use: Not Currently    Alcohol/week: 3.0 standard drinks    Types: 3 Cans of beer per week  . Drug use: Not Currently    Types: Marijuana    Home Medications Prior to Admission medications   Medication Sig Start Date End Date Taking? Authorizing Provider  albuterol (VENTOLIN HFA) 108 (90 Base) MCG/ACT inhaler Inhale 2 puffs into the lungs every 4 (four) hours as needed for wheezing.  06/30/17   [provider]  cetirizine (ZYRTEC ALLERGY) 10 MG tablet Take 1 tablet (10 mg total) by mouth daily. 03/31/20   Little, Ambrose Finland, MD  Fluticasone-Salmeterol (ADVAIR DISKUS) 250-50 MCG/DOSE AEPB Inhale 1 puff into the lungs every 12 (twelve) hours. 03/31/20   Little, Ambrose Finland, MD  predniSONE (DELTASONE) 20 MG tablet Take 2 tablets (40 mg total) by  mouth daily for 5 days. 04/22/20 04/27/20  Virgina Norfolk, DO    Allergies    Azithromycin  Review of Systems   Review of Systems  Constitutional: Negative for chills and fever.  HENT: Negative for ear pain and sore throat.   Eyes: Negative for pain and visual disturbance.  Respiratory: Positive for shortness of breath and wheezing. Negative for cough.   Cardiovascular: Negative for chest pain, palpitations and claudication.  Gastrointestinal: Negative for abdominal pain and vomiting.  Genitourinary: Negative for dysuria and hematuria.  Musculoskeletal: Negative for arthralgias and back pain.  Skin: Negative for color change and rash.  Neurological: Negative for seizures and syncope.  All other systems reviewed and are negative.   Physical Exam Updated Vital Signs  ED Triage Vitals [04/22/20 0712]  Enc Vitals Group     BP 123/66     Pulse Rate 61     Resp 20     Temp 98.7 F (37.1 C)     Temp Source Oral     SpO2 100 %     Weight 167 lb (75.8 kg)     Height 5\' 1"  (1.549 m)     Head Circumference      Peak Flow      Pain Score 4     Pain Loc      Pain Edu?      Excl. in GC?  Physical Exam Vitals and nursing note reviewed.  Constitutional:      General: She is not in acute distress.    Appearance: She is well-developed. She is not ill-appearing.  HENT:     Head: Normocephalic and atraumatic.     Nose: Nose normal.     Mouth/Throat:     Mouth: Mucous membranes are moist.  Eyes:     Extraocular Movements: Extraocular movements intact.     Conjunctiva/sclera: Conjunctivae normal.     Pupils: Pupils are equal, round, and reactive to light.  Cardiovascular:     Rate and Rhythm: Normal rate and regular rhythm.     Pulses: Normal pulses.     Heart sounds: Normal heart sounds. No murmur.  Pulmonary:     Effort: Pulmonary effort is normal. No respiratory distress.     Breath sounds: Normal breath sounds.  Abdominal:     Palpations: Abdomen is soft.      Tenderness: There is no abdominal tenderness.  Musculoskeletal:     Cervical back: Normal range of motion and neck supple.  Skin:    General: Skin is warm and dry.     Capillary Refill: Capillary refill takes less than 2 seconds.  Neurological:     General: No focal deficit present.     Mental Status: She is alert.     ED Results / Procedures / Treatments   Labs (all labs ordered are listed, but only abnormal results are displayed) Labs Reviewed - No data to display  EKG None  Radiology No results found.  Procedures Procedures (including critical care time)  Medications Ordered in ED Medications  albuterol (VENTOLIN HFA) 108 (90 Base) MCG/ACT inhaler (has no administration in time range)    ED Course  I have reviewed the triage vital signs and the nursing notes.  Pertinent labs & imaging results that were available during my care of the patient were reviewed by me and considered in my medical decision making (see chart for details).    MDM Rules/Calculators/A&P                      Melanie Campbell is a 24 year old female with history of asthma who presents the ED with asthma symptoms.  Normal vitals.  No fever.  Asthma type symptoms for the last several days however her inhalers broken.  Got a breathing treatment before my evaluation and overall appears comfortable.  No sputum production.  No chest pain.  Likely mild asthma exacerbation.  She denies pregnancy or concern for pregnancy.  Will give steroids for the next couple days.  Given return precautions.  Discharged in ED in good condition.  No chest pain and no concern for PE or ACS.  This chart was dictated using voice recognition software.  Despite best efforts to proofread,  errors can occur which can change the documentation meaning.    Final Clinical Impression(s) / ED Diagnoses Final diagnoses:  Mild intermittent asthma, unspecified whether complicated    Rx / DC Orders ED Discharge Orders         Ordered      predniSONE (DELTASONE) 20 MG tablet  Daily     04/22/20 [place], [place], DO 04/22/20 [phone]

## 2020-04-22 NOTE — ED Notes (Signed)
Pt discharged to home. Discharge instructions have been discussed with patient and/or family members. Pt verbally acknowledges understanding d/c instructions, and endorses comprehension to checkout at registration before leaving.  °

## 2020-04-22 NOTE — ED Notes (Signed)
ED Provider at bedside. 

## 2020-04-22 NOTE — ED Triage Notes (Signed)
Pt reports asthma exacerbation. States inhaler was broken, hasnt used inhaler since Saturday. C/o chest tightness. Denies fever, reports cough

## 2020-06-09 ENCOUNTER — Emergency Department (HOSPITAL_BASED_OUTPATIENT_CLINIC_OR_DEPARTMENT_OTHER): Payer: Self-pay

## 2020-06-09 ENCOUNTER — Other Ambulatory Visit: Payer: Self-pay

## 2020-06-09 ENCOUNTER — Emergency Department (HOSPITAL_BASED_OUTPATIENT_CLINIC_OR_DEPARTMENT_OTHER)
Admission: EM | Admit: 2020-06-09 | Discharge: 2020-06-09 | Disposition: A | Discharge status: Home / Self Care | Payer: Self-pay | Attending: Emergency Medicine | Admitting: Emergency Medicine

## 2020-06-09 ENCOUNTER — Encounter (HOSPITAL_BASED_OUTPATIENT_CLINIC_OR_DEPARTMENT_OTHER): Payer: Self-pay

## 2020-06-09 DIAGNOSIS — Z79899 Other long term (current) drug therapy: Secondary | ICD-10-CM | POA: Insufficient documentation

## 2020-06-09 DIAGNOSIS — J45901 Unspecified asthma with (acute) exacerbation: Secondary | ICD-10-CM | POA: Insufficient documentation

## 2020-06-09 DIAGNOSIS — J4521 Mild intermittent asthma with (acute) exacerbation: Secondary | ICD-10-CM

## 2020-06-09 DIAGNOSIS — Z7951 Long term (current) use of inhaled steroids: Secondary | ICD-10-CM | POA: Insufficient documentation

## 2020-06-09 MED ORDER — ALBUTEROL SULFATE HFA 108 (90 BASE) MCG/ACT IN AERS
2.0000 | INHALATION_SPRAY | Freq: Once | RESPIRATORY_TRACT | Status: AC
  Administered 2020-06-09: 2 via RESPIRATORY_TRACT

## 2020-06-09 MED ORDER — PREDNISONE 20 MG PO TABS: ORAL_TABLET | ORAL | 0 refills | Status: DC

## 2020-06-09 MED ORDER — ALBUTEROL SULFATE HFA 108 (90 BASE) MCG/ACT IN AERS
4.0000 | INHALATION_SPRAY | Freq: Once | RESPIRATORY_TRACT | Status: AC
  Administered 2020-06-09: 4 via RESPIRATORY_TRACT
  Filled 2020-06-09: qty 6.7

## 2020-06-09 MED FILL — predniSONE 20 MG TABS: 20 | 5 days supply | Qty: 11 | Fill #0

## 2020-06-09 NOTE — ED Triage Notes (Signed)
Pt arrives with c/o SOB reports she no longer has an inhaler at home.

## 2020-06-09 NOTE — ED Notes (Signed)
ED Provider at bedside. 

## 2020-06-09 NOTE — ED Provider Notes (Signed)
[facility] EMERGENCY DEPARTMENT Provider Note   CSN: [id number] Arrival date & time: 06/09/20  1046     History Chief Complaint  Patient presents with  . Shortness of Breath    Melanie Campbell is a 24 y.o. female.  Patient is a 24 year old female with a history of asthma who presents with wheezing and shortness of breath.  She does her asthma has been acting up since yesterday.  She ran out of her inhaler.  She does not have a PCP.  She has no productive cough.  No fevers.  No associated chest pain.        Past Medical History:  Diagnosis Date  . Anxiety   . Asthma   . WPW (Wolff-Parkinson-White syndrome)     There are no problems to display for this patient.   History reviewed. No pertinent surgical history.   OB History   No obstetric history on file.     No family history on file.  Social History   Tobacco Use  . Smoking status: Never Smoker  . Smokeless tobacco: Never Used  Vaping Use  . Vaping Use: Never used  Substance Use Topics  . Alcohol use: Yes    Alcohol/week: 3.0 standard drinks    Types: 3 Cans of beer per week  . Drug use: Yes    Types: Marijuana    Home Medications Prior to Admission medications   Medication Sig Start Date End Date Taking? Authorizing Provider  albuterol (VENTOLIN HFA) 108 (90 Base) MCG/ACT inhaler Inhale 2 puffs into the lungs every 4 (four) hours as needed for wheezing.  06/30/17   [provider]  cetirizine (ZYRTEC ALLERGY) 10 MG tablet Take 1 tablet (10 mg total) by mouth daily. 03/31/20   Little, Ambrose Finland, MD  Fluticasone-Salmeterol (ADVAIR DISKUS) 250-50 MCG/DOSE AEPB Inhale 1 puff into the lungs every 12 (twelve) hours. 03/31/20   Little, Ambrose Finland, MD    Allergies    Azithromycin  Review of Systems   Review of Systems  Constitutional: Negative for chills, diaphoresis, fatigue and fever.  HENT: Negative for congestion, rhinorrhea and sneezing.   Eyes: Negative.   Respiratory:  Positive for cough, shortness of breath and wheezing. Negative for chest tightness.   Cardiovascular: Negative for chest pain and leg swelling.  Gastrointestinal: Negative for abdominal pain, blood in stool, diarrhea, nausea and vomiting.  Genitourinary: Negative for difficulty urinating, flank pain, frequency and hematuria.  Musculoskeletal: Negative for arthralgias and back pain.  Skin: Negative for rash.  Neurological: Negative for dizziness, speech difficulty, weakness, numbness and headaches.    Physical Exam Updated Vital Signs BP 117/77 (BP Location: Right Arm)   Pulse (!) 56   Temp 99.1 F (37.3 C) (Oral)   Resp 18   Ht 5\' 1"  (1.549 m)   Wt 77.1 kg   LMP 05/15/2020   SpO2 100%   BMI 32.12 kg/m   Physical Exam Constitutional:      Appearance: She is well-developed.  HENT:     Head: Normocephalic and atraumatic.  Eyes:     Pupils: Pupils are equal, round, and reactive to light.  Cardiovascular:     Rate and Rhythm: Normal rate and regular rhythm.     Heart sounds: Normal heart sounds.  Pulmonary:     Effort: Pulmonary effort is normal. No respiratory distress.     Breath sounds: Wheezing present. No rales.     Comments: Positive expiratory wheezes, no increased work of breathing, she is  talking in full sentences Chest:     Chest [place]: No tenderness.  Abdominal:     General: Bowel sounds are normal.     Palpations: Abdomen is soft.     Tenderness: There is no abdominal tenderness. There is no guarding or rebound.  Musculoskeletal:        General: Normal range of motion.     Cervical back: Normal range of motion and neck supple.  Lymphadenopathy:     Cervical: No cervical adenopathy.  Skin:    General: Skin is warm and dry.     Findings: No rash.  Neurological:     Mental Status: She is alert and oriented to person, place, and time.     ED Results / Procedures / Treatments   Labs (all labs ordered are listed, but only abnormal results are displayed) Labs  Reviewed - No data to display  EKG None  Radiology DG Chest 2 View  Result Date: 06/09/2020 CLINICAL DATA:  Shortness of breath EXAM: CHEST - 2 VIEW COMPARISON:  11/05/2019 FINDINGS: The heart size and mediastinal contours are within normal limits. Both lungs are clear. The visualized skeletal structures are unremarkable. IMPRESSION: No active cardiopulmonary disease. Electronically Signed   By: Duanne Guess D.O.   On: 06/09/2020 11:05    Procedures Procedures (including critical care time)  Medications Ordered in ED Medications  albuterol (VENTOLIN HFA) 108 (90 Base) MCG/ACT inhaler 4 puff (4 puffs Inhalation Given by Other 06/09/20 1055)  albuterol (VENTOLIN HFA) 108 (90 Base) MCG/ACT inhaler 2 puff (2 puffs Inhalation Given 06/09/20 1222)    ED Course  I have reviewed the triage vital signs and the nursing notes.  Pertinent labs & imaging results that were available during my care of the patient were reviewed by me and considered in my medical decision making (see chart for details).    MDM Rules/Calculators/A&P                          Patient is a 24 year old female with a history of asthma that is poorly controlled who presents with wheezing and shortness of breath.  Her symptoms are consistent with her prior asthma exacerbations.  She feels better after treatment in the ED with albuterol.  Chest x-ray is clear without evidence of pneumonia.  She was dispensed an albuterol inhaler.  She will be put on a short course of prednisone.  I did talk to her about better management of her asthma and the danger of repeated uses of steroid burst.  I will consult case management to help her get follow-up with the PCP.  Return precautions were given. Final Clinical Impression(s) / ED Diagnoses Final diagnoses:  Mild intermittent asthma with exacerbation    Rx / DC Orders ED Discharge Orders    None       Rolan Bucco, MD 06/09/20 1236

## 2020-06-09 NOTE — Progress Notes (Addendum)
TOC CM spoke to pt via phone and discussed PCP and insurance. Pt states she does not have a PCP. Provided pt with contact information on [facility] in [facility]. Pt declined appt in [facility] clinics. She plans to stop by clinic to complete application so they can arrange for her to see a PCP. Pt states she has $4 to pick up Prednisone at [facility]. She will be provided with an inhaler. ED provider updated. Isidoro Donning RN CCM, [facility] ED TOC CM [phone]

## 2020-06-16 ENCOUNTER — Other Ambulatory Visit: Payer: Self-pay

## 2020-06-16 ENCOUNTER — Emergency Department (HOSPITAL_BASED_OUTPATIENT_CLINIC_OR_DEPARTMENT_OTHER)
Admission: EM | Admit: 2020-06-16 | Discharge: 2020-06-16 | Disposition: A | Discharge status: Home / Self Care | Payer: Medicaid Other | Attending: Emergency Medicine | Admitting: Emergency Medicine

## 2020-06-16 ENCOUNTER — Encounter (HOSPITAL_BASED_OUTPATIENT_CLINIC_OR_DEPARTMENT_OTHER): Payer: Self-pay | Admitting: *Deleted

## 2020-06-16 DIAGNOSIS — R112 Nausea with vomiting, unspecified: Secondary | ICD-10-CM | POA: Insufficient documentation

## 2020-06-16 DIAGNOSIS — R509 Fever, unspecified: Secondary | ICD-10-CM | POA: Insufficient documentation

## 2020-06-16 DIAGNOSIS — Z20822 Contact with and (suspected) exposure to covid-19: Secondary | ICD-10-CM | POA: Insufficient documentation

## 2020-06-16 DIAGNOSIS — R519 Headache, unspecified: Secondary | ICD-10-CM | POA: Insufficient documentation

## 2020-06-16 DIAGNOSIS — J45909 Unspecified asthma, uncomplicated: Secondary | ICD-10-CM | POA: Insufficient documentation

## 2020-06-16 DIAGNOSIS — J029 Acute pharyngitis, unspecified: Secondary | ICD-10-CM | POA: Insufficient documentation

## 2020-06-16 DIAGNOSIS — R1084 Generalized abdominal pain: Secondary | ICD-10-CM | POA: Insufficient documentation

## 2020-06-16 LAB — GROUP A STREP BY PCR: Group A Strep by PCR: NOT DETECTED

## 2020-06-16 MED ORDER — ONDANSETRON 4 MG PO TBDP: 4.0000 mg | ORAL_TABLET | ORAL | 0 refills | Status: DC | PRN

## 2020-06-16 MED ORDER — IBUPROFEN 800 MG PO TABS: 800.0000 mg | ORAL_TABLET | Freq: Three times a day (TID) | ORAL | 0 refills | Status: DC

## 2020-06-16 MED ORDER — OMEPRAZOLE 20 MG PO CPDR
20.0000 mg | DELAYED_RELEASE_CAPSULE | Freq: Every day | ORAL | 0 refills | Status: DC
Start: 2020-06-16 — End: 2020-07-23

## 2020-06-16 NOTE — Discharge Instructions (Signed)
1.  Take omeprazole daily about 30 minutes before you eat or drink anything in the morning.  This is to help protect your stomach from acid production while you are taking ibuprofen and having nausea. 2.  Take ibuprofen every 8 hours with a small amount of food to help with headache and body aches. 3.  Try to rest and stay hydrated. 4.  A Covid test and strep test were done in the emergency department.  Check the results on your "MyChart".  These should be available within the next 24 hours. 5.  Follow instructions for awaiting your Covid test. 6.  Return to emergency department immediately if you develop worsening or changing symptoms.

## 2020-06-16 NOTE — ED Notes (Signed)
ED Provider at bedside. 

## 2020-06-16 NOTE — ED Triage Notes (Signed)
Headache and vomiting x 3 days.

## 2020-06-16 NOTE — ED Provider Notes (Signed)
[facility] EMERGENCY DEPARTMENT Provider Note   CSN: [id number] Arrival date & time: 06/16/20  1701     History Chief Complaint  Patient presents with  . Emesis  . Headache    Melanie Campbell is a 24 y.o. female.  HPI Patient reports she is had a headache and abdominal pain and nausea for about 3 days.  Symptoms started first with some generalized abdominal discomfort.  Her stomach was achy for about a day.  She then started developing a generalized headache.  She reports it feels like a band around her forehead and temples.  She denies light sensitivity or neck stiffness.  She reports she has had some low-grade fever today.  Temperature up to 100 and then 99.  She reports if she tries to eat, she will vomit afterwards.  She reports she feels like she is starting to get a sore throat as well.  Patient reports there was some concern that she might have Covid.  She reports she generally doubts that however because she has asthma and she has not had any coughing or shortness of breath.  No chest pain.  No pain or urgency with urination.  She has been trying some Excedrin for headache without much relief.    Past Medical History:  Diagnosis Date  . Anxiety   . Asthma   . WPW (Wolff-Parkinson-White syndrome)     There are no problems to display for this patient.   History reviewed. No pertinent surgical history.   OB History   No obstetric history on file.     No family history on file.  Social History   Tobacco Use  . Smoking status: Never Smoker  . Smokeless tobacco: Never Used  Vaping Use  . Vaping Use: Never used  Substance Use Topics  . Alcohol use: Yes    Alcohol/week: 3.0 standard drinks    Types: 3 Cans of beer per week  . Drug use: Yes    Types: Marijuana    Home Medications Prior to Admission medications   Medication Sig Start Date End Date Taking? Authorizing Provider  albuterol (VENTOLIN HFA) 108 (90 Base) MCG/ACT inhaler Inhale 2 puffs into  the lungs every 4 (four) hours as needed for wheezing.  06/30/17  Yes [provider]  cetirizine (ZYRTEC ALLERGY) 10 MG tablet Take 1 tablet (10 mg total) by mouth daily. 03/31/20  Yes Little, Ambrose Finland, MD  Fluticasone-Salmeterol (ADVAIR DISKUS) 250-50 MCG/DOSE AEPB Inhale 1 puff into the lungs every 12 (twelve) hours. 03/31/20  Yes Little, Ambrose Finland, MD  predniSONE (DELTASONE) 20 MG tablet 3 tabs po day one, then 2 po daily x 4 days 06/09/20  Yes Rolan Bucco, MD  ibuprofen (ADVIL) 800 MG tablet Take 1 tablet (800 mg total) by mouth 3 (three) times daily. 06/16/20   Arby Barrette, MD  omeprazole (PRILOSEC) 20 MG capsule Take 1 capsule (20 mg total) by mouth daily. 06/16/20   Arby Barrette, MD    Allergies    Azithromycin  Review of Systems   Review of Systems 10 systems reviewed and negative except as per HPI Physical Exam Updated Vital Signs BP 121/79 (BP Location: Right Arm)   Pulse 71   Temp 99 F (37.2 C) (Oral)   Resp 18   Ht 5\' 1"  (1.549 m)   Wt 77.1 kg   LMP 05/21/2020   SpO2 100%   BMI 32.12 kg/m   Physical Exam Constitutional:      Comments: Alert nontoxic,  clinically well in appearance.  HENT:     Head: Normocephalic and atraumatic.     Right Ear: Tympanic membrane normal.     Left Ear: Tympanic membrane normal.     Nose: Nose normal.     Mouth/Throat:     Comments: Mucous membranes moist and clear.  Very mild erythema to the tonsillar pillars. Eyes:     Extraocular Movements: Extraocular movements intact.     Conjunctiva/sclera: Conjunctivae normal.  Cardiovascular:     Rate and Rhythm: Normal rate and regular rhythm.  Pulmonary:     Effort: Pulmonary effort is normal.     Breath sounds: Normal breath sounds.  Abdominal:     General: There is no distension.     Palpations: Abdomen is soft.     Tenderness: There is no abdominal tenderness. There is no guarding.  Musculoskeletal:        General: No swelling or tenderness. Normal range of  motion.     Cervical back: Neck supple.     Right lower leg: No edema.     Left lower leg: No edema.  Skin:    General: Skin is warm and dry.  Neurological:     General: No focal deficit present.     Mental Status: She is oriented to person, place, and time.     Cranial Nerves: No cranial nerve deficit.     Motor: No weakness.     Coordination: Coordination normal.  Psychiatric:        Mood and Affect: Mood normal.     ED Results / Procedures / Treatments   Labs (all labs ordered are listed, but only abnormal results are displayed) Labs Reviewed  GROUP A STREP BY PCR  SARS CORONAVIRUS 2 (TAT 6-24 HRS)    EKG None  Radiology No results found.  Procedures Procedures (including critical care time)  Medications Ordered in ED Medications - No data to display  ED Course  I have reviewed the triage vital signs and the nursing notes.  Pertinent labs & imaging results that were available during my care of the patient were reviewed by me and considered in my medical decision making (see chart for details).    MDM Rules/Calculators/A&P                          Patient is nontoxic in clinically well in appearance.  Patient has some concern for possible Covid.  Covid and strep testing obtained.  Patient does not have photophobia or neck stiffness.  Low suspicion for meningitis.  She is 3 days of illness and clinically well in appearance without typical symptoms.  Abdominal pain has mostly subsided.  She has some general discomfort but no reproducible pain to suggest surgical abdomen.  At this time, highest suspicion for viral illness.  Recommendation for ibuprofen and omeprazole.  At this time have avoided using Zofran due to patient's documented history of WPW.  No signs that this is an issue currently.  She has no palpitations or chest pain.  Return precautions reviewed. Final Clinical Impression(s) / ED Diagnoses Final diagnoses:  Acute nonintractable headache, unspecified  headache type  Generalized abdominal pain    Rx / DC Orders ED Discharge Orders         Ordered    ondansetron (ZOFRAN ODT) 4 MG disintegrating tablet  Every 4 hours PRN,   Status:  Discontinued     Reprint     06/16/20 2123  ibuprofen (ADVIL) 800 MG tablet  3 times daily     Discontinue  Reprint     06/16/20 2123    omeprazole (PRILOSEC) 20 MG capsule  Daily     Discontinue  Reprint     06/16/20 2123           Arby Barrette, MD 06/16/20 2141

## 2020-06-17 LAB — SARS CORONAVIRUS 2 (TAT 6-24 HRS): SARS Coronavirus 2: NEGATIVE

## 2020-06-19 ENCOUNTER — Encounter (HOSPITAL_BASED_OUTPATIENT_CLINIC_OR_DEPARTMENT_OTHER): Payer: Self-pay | Admitting: Emergency Medicine

## 2020-06-19 ENCOUNTER — Other Ambulatory Visit: Payer: Self-pay

## 2020-06-19 ENCOUNTER — Emergency Department (HOSPITAL_BASED_OUTPATIENT_CLINIC_OR_DEPARTMENT_OTHER)
Admission: EM | Admit: 2020-06-19 | Discharge: 2020-06-19 | Disposition: A | Discharge status: Home / Self Care | Payer: Medicaid Other | Attending: Emergency Medicine | Admitting: Emergency Medicine

## 2020-06-19 DIAGNOSIS — Z7951 Long term (current) use of inhaled steroids: Secondary | ICD-10-CM | POA: Insufficient documentation

## 2020-06-19 DIAGNOSIS — J45909 Unspecified asthma, uncomplicated: Secondary | ICD-10-CM | POA: Insufficient documentation

## 2020-06-19 DIAGNOSIS — R519 Headache, unspecified: Secondary | ICD-10-CM | POA: Insufficient documentation

## 2020-06-19 MED ORDER — KETOROLAC TROMETHAMINE 60 MG/2ML IM SOLN
60.0000 mg | Freq: Once | INTRAMUSCULAR | Status: AC
  Administered 2020-06-19: 60 mg via INTRAMUSCULAR
  Filled 2020-06-19: qty 2

## 2020-06-19 MED ORDER — METOCLOPRAMIDE HCL 10 MG PO TABS
10.0000 mg | ORAL_TABLET | Freq: Two times a day (BID) | ORAL | 0 refills | Status: DC | PRN
Start: 2020-06-19 — End: 2020-07-23

## 2020-06-19 MED ORDER — DEXAMETHASONE 6 MG PO TABS
10.0000 mg | ORAL_TABLET | Freq: Once | ORAL | Status: AC
  Administered 2020-06-19: 10 mg via ORAL
  Filled 2020-06-19: qty 1

## 2020-06-19 NOTE — ED Triage Notes (Signed)
Headache x 4 days. Has been seen x 2 without relief.

## 2020-06-19 NOTE — Discharge Instructions (Addendum)
Recommend continued use of Motrin, 600 mg every 8 hours as needed, Tylenol 1000 mg every 6 hours as needed.  Take Reglan as needed as well.

## 2020-06-19 NOTE — ED Provider Notes (Signed)
[facility] EMERGENCY DEPARTMENT Provider Note   CSN: [id number] Arrival date & time: 06/19/20  1557     History Chief Complaint  Patient presents with  . Headache    Melanie Campbell is a 24 y.o. female.  The history is provided by the patient.  Migraine This is a new problem. The current episode started more than 2 days ago. The problem occurs daily. Associated symptoms include headaches. Pertinent negatives include no chest pain, no abdominal pain and no shortness of breath. Nothing aggravates the symptoms. The symptoms are relieved by NSAIDs. The treatment provided mild relief.       Past Medical History:  Diagnosis Date  . Anxiety   . Asthma   . WPW (Wolff-Parkinson-White syndrome)     There are no problems to display for this patient.   History reviewed. No pertinent surgical history.   OB History   No obstetric history on file.     No family history on file.  Social History   Tobacco Use  . Smoking status: Never Smoker  . Smokeless tobacco: Never Used  Vaping Use  . Vaping Use: Never used  Substance Use Topics  . Alcohol use: Yes    Alcohol/week: 3.0 standard drinks    Types: 3 Cans of beer per week  . Drug use: Yes    Types: Marijuana    Home Medications Prior to Admission medications   Medication Sig Start Date End Date Taking? Authorizing Provider  albuterol (VENTOLIN HFA) 108 (90 Base) MCG/ACT inhaler Inhale 2 puffs into the lungs every 4 (four) hours as needed for wheezing.  06/30/17   [provider]  cetirizine (ZYRTEC ALLERGY) 10 MG tablet Take 1 tablet (10 mg total) by mouth daily. 03/31/20   Little, Melanie Finland, MD  Fluticasone-Salmeterol (ADVAIR DISKUS) 250-50 MCG/DOSE AEPB Inhale 1 puff into the lungs every 12 (twelve) hours. 03/31/20   Little, Melanie Finland, MD  ibuprofen (ADVIL) 800 MG tablet Take 1 tablet (800 mg total) by mouth 3 (three) times daily. 06/16/20   Arby Barrette, MD  metoCLOPramide (REGLAN) 10 MG  tablet Take 1 tablet (10 mg total) by mouth every 12 (twelve) hours as needed for up to 5 doses (headache/nausea/vomting). 06/19/20   Melanie Beggs, DO  omeprazole (PRILOSEC) 20 MG capsule Take 1 capsule (20 mg total) by mouth daily. 06/16/20   Arby Barrette, MD  predniSONE (DELTASONE) 20 MG tablet 3 tabs po day one, then 2 po daily x 4 days 06/09/20   Melanie Bucco, MD    Allergies    Azithromycin  Review of Systems   Review of Systems  Constitutional: Negative for chills and fever.  HENT: Negative for ear pain and sore throat.   Eyes: Negative for pain and visual disturbance.  Respiratory: Negative for cough and shortness of breath.   Cardiovascular: Negative for chest pain and palpitations.  Gastrointestinal: Negative for abdominal pain and vomiting.  Genitourinary: Negative for dysuria and hematuria.  Musculoskeletal: Negative for arthralgias and back pain.  Skin: Negative for color change and rash.  Neurological: Positive for headaches. Negative for seizures and syncope.  All other systems reviewed and are negative.   Physical Exam Updated Vital Signs  ED Triage Vitals  Enc Vitals Group     BP 06/19/20 1609 98/75     Pulse Rate 06/19/20 1609 92     Resp 06/19/20 1609 18     Temp 06/19/20 1609 98.3 F (36.8 C)     Temp Source 06/19/20  1609 Oral     SpO2 06/19/20 1609 98 %     Weight 06/19/20 1608 170 lb (77.1 kg)     Height 06/19/20 1608 5\' 1"  (1.549 m)     Head Circumference --      Peak Flow --      Pain Score 06/19/20 1608 10     Pain Loc --      Pain Edu? --      Excl. in GC? --     Physical Exam Vitals and nursing note reviewed.  Constitutional:      General: She is not in acute distress.    Appearance: She is well-developed.  HENT:     Head: Normocephalic and atraumatic.  Eyes:     General: No visual field deficit.    Extraocular Movements: Extraocular movements intact.     Conjunctiva/sclera: Conjunctivae normal.     Pupils: Pupils are equal, round,  and reactive to light.  Cardiovascular:     Rate and Rhythm: Normal rate and regular rhythm.     Heart sounds: No murmur heard.   Pulmonary:     Effort: Pulmonary effort is normal. No respiratory distress.     Breath sounds: Normal breath sounds.  Abdominal:     Palpations: Abdomen is soft.     Tenderness: There is no abdominal tenderness.  Musculoskeletal:        General: Normal range of motion.     Cervical back: Normal range of motion and neck supple.  Skin:    General: Skin is warm and dry.  Neurological:     Mental Status: She is alert and oriented to person, place, and time.     Cranial Nerves: No cranial nerve deficit.     Sensory: No sensory deficit.     Motor: No weakness.     Coordination: Coordination normal.     Gait: Gait normal.  Psychiatric:        Mood and Affect: Mood normal.     ED Results / Procedures / Treatments   Labs (all labs ordered are listed, but only abnormal results are displayed) Labs Reviewed - No data to display  EKG None  Radiology No results found.  Procedures Procedures (including critical care time)  Medications Ordered in ED Medications  dexamethasone (DECADRON) tablet 10 mg (has no administration in time range)  ketorolac (TORADOL) injection 60 mg (has no administration in time range)    ED Course  I have reviewed the triage vital signs and the nursing notes.  Pertinent labs & imaging results that were available during my care of the patient were reviewed by me and considered in my medical decision making (see chart for details).    MDM Rules/Calculators/A&P                          [name] Is a 24 year old female who presents the ED with headache.  Patient normal vitals.  No fever.  Recent negative Covid and strep test.  Has been having mild headache for the last several days.  No concern for meningitis.  Normal vitals, normal neurological exam.  Has been using some Tylenol Motrin with some minimal relief.   Patient did drive here therefore will treat with Decadron and Toradol shot.  Will prescribe Reglan to use with some Benadryl at home as needed.  Recommend ongoing use of Motrin and Tylenol with appropriate dosing.  Understands return precautions and discharged from ED in  good condition.  Overall likely tension/migraine type headache.  This chart was dictated using voice recognition software.  Despite best efforts to proofread,  errors can occur which can change the documentation meaning.    Final Clinical Impression(s) / ED Diagnoses Final diagnoses:  Acute nonintractable headache, unspecified headache type    Rx / DC Orders ED Discharge Orders         Ordered    metoCLOPramide (REGLAN) 10 MG tablet  Every 12 hours PRN     Discontinue  Reprint     06/19/20 1821           Virgina Norfolk, DO 06/19/20 1822

## 2020-06-22 ENCOUNTER — Emergency Department (HOSPITAL_BASED_OUTPATIENT_CLINIC_OR_DEPARTMENT_OTHER)
Admission: EM | Admit: 2020-06-22 | Discharge: 2020-06-22 | Disposition: A | Discharge status: Home / Self Care | Payer: Self-pay | Attending: Emergency Medicine | Admitting: Emergency Medicine

## 2020-06-22 ENCOUNTER — Emergency Department (HOSPITAL_BASED_OUTPATIENT_CLINIC_OR_DEPARTMENT_OTHER): Payer: Self-pay

## 2020-06-22 ENCOUNTER — Encounter (HOSPITAL_BASED_OUTPATIENT_CLINIC_OR_DEPARTMENT_OTHER): Payer: Self-pay | Admitting: Emergency Medicine

## 2020-06-22 ENCOUNTER — Other Ambulatory Visit: Payer: Self-pay

## 2020-06-22 DIAGNOSIS — F419 Anxiety disorder, unspecified: Secondary | ICD-10-CM | POA: Insufficient documentation

## 2020-06-22 DIAGNOSIS — J45909 Unspecified asthma, uncomplicated: Secondary | ICD-10-CM | POA: Insufficient documentation

## 2020-06-22 DIAGNOSIS — R112 Nausea with vomiting, unspecified: Secondary | ICD-10-CM | POA: Insufficient documentation

## 2020-06-22 DIAGNOSIS — G44209 Tension-type headache, unspecified, not intractable: Secondary | ICD-10-CM | POA: Insufficient documentation

## 2020-06-22 DIAGNOSIS — R519 Headache, unspecified: Secondary | ICD-10-CM

## 2020-06-22 LAB — BASIC METABOLIC PANEL
Anion gap: 10 (ref 5–15)
BUN: 14 mg/dL (ref 6–20)
CO2: 23 mmol/L (ref 22–32)
Calcium: 8.7 mg/dL — ABNORMAL LOW (ref 8.9–10.3)
Chloride: 105 mmol/L (ref 98–111)
Creatinine, Ser: 1.02 mg/dL — ABNORMAL HIGH (ref 0.44–1.00)
GFR calc Af Amer: >60 mL/min (ref >60)
GFR calc non Af Amer: >60 mL/min (ref >60)
Glucose, Bld: 91 mg/dL (ref 70–99)
Potassium: 4 mmol/L (ref 3.5–5.1)
Sodium: 138 mmol/L (ref 135–145)

## 2020-06-22 LAB — CBC
HCT: 36.7 % (ref 36.0–46.0)
Hemoglobin: 11.6 g/dL — ABNORMAL LOW (ref 12.0–15.0)
MCH: 26.8 pg (ref 26.0–34.0)
MCHC: 31.6 g/dL (ref 30.0–36.0)
MCV: 84.8 fL (ref 80.0–100.0)
Platelets: 272 10*3/uL (ref 150–400)
RBC: 4.33 MIL/uL (ref 3.87–5.11)
RDW: 16.3 % — ABNORMAL HIGH (ref 11.5–15.5)
WBC: 5.9 10*3/uL (ref 4.0–10.5)
nRBC: 0 % (ref 0.0–0.2)

## 2020-06-22 MED ORDER — IOHEXOL 350 MG/ML SOLN
100.0000 mL | Freq: Once | INTRAVENOUS | Status: AC | PRN
  Administered 2020-06-22: 100 mL via INTRAVENOUS

## 2020-06-22 MED ORDER — DIPHENHYDRAMINE HCL 50 MG/ML IJ SOLN
25.0000 mg | Freq: Once | INTRAMUSCULAR | Status: AC
  Administered 2020-06-22: 25 mg via INTRAVENOUS
  Filled 2020-06-22: qty 1

## 2020-06-22 MED ORDER — SODIUM CHLORIDE 0.9 % IV BOLUS
1000.0000 mL | Freq: Once | INTRAVENOUS | Status: AC
  Administered 2020-06-22: 1000 mL via INTRAVENOUS

## 2020-06-22 MED ORDER — DEXAMETHASONE SODIUM PHOSPHATE 10 MG/ML IJ SOLN
10.0000 mg | Freq: Once | INTRAMUSCULAR | Status: AC
  Administered 2020-06-22: 10 mg via INTRAVENOUS
  Filled 2020-06-22: qty 1

## 2020-06-22 MED ORDER — METOCLOPRAMIDE HCL 5 MG/ML IJ SOLN
10.0000 mg | Freq: Once | INTRAMUSCULAR | Status: AC
  Administered 2020-06-22: 10 mg via INTRAVENOUS
  Filled 2020-06-22: qty 2

## 2020-06-22 MED ORDER — KETOROLAC TROMETHAMINE 15 MG/ML IJ SOLN
15.0000 mg | Freq: Once | INTRAMUSCULAR | Status: AC
  Administered 2020-06-22: 15 mg via INTRAVENOUS
  Filled 2020-06-22: qty 1

## 2020-06-22 NOTE — ED Triage Notes (Signed)
Pt c/o headache x 1 week.  Pt reports that this is her 3rd visit to the ER, reports prescribed medications are not helping.

## 2020-06-22 NOTE — Discharge Instructions (Signed)
Please read and follow all provided instructions.  Your diagnoses today include:  1. Acute nonintractable headache, unspecified headache type     Tests performed today include:  CT of your head which was normal and did not show any serious cause of your headache  CT angiography of the brain - does not show any aneurysms or other serious problems with the blood vessels in the brain  Vital signs. See below for your results today.   Medications:  In the Emergency Department you received:  Reglan - antinausea/headache medication  Benadryl - antihistamine to counteract potential side effects of reglan  Toradol - NSAID medication similar to ibuprofen  Decadron - steroid which may help HA   Take any prescribed medications only as directed.  Additional information:  Follow any educational materials contained in this packet.  You are having a headache. No specific cause was found today for your headache. It may have been a migraine or other cause of headache. Stress, anxiety, fatigue, and depression are common triggers for headaches.   Your headache today does not appear to be life-threatening or require hospitalization, but often the exact cause of headaches is not determined in the emergency department. Therefore, follow-up with your doctor is very important to find out what may have caused your headache and whether or not you need any further diagnostic testing or treatment.   Sometimes headaches can appear benign (not harmful), but then more serious symptoms can develop which should prompt an immediate re-evaluation by your doctor or the emergency department.  BE VERY CAREFUL not to take multiple medicines containing Tylenol (also called acetaminophen). Doing so can lead to an overdose which can damage your liver and cause liver failure and possibly death.   Follow-up instructions: Please follow-up with your primary care provider in the next 3 days for further evaluation of your  symptoms.   Return instructions:   Please return to the Emergency Department if you experience worsening symptoms.  Return if the medications do not resolve your headache, if it recurs, or if you have multiple episodes of vomiting or cannot keep down fluids.  Return if you have a change from the usual headache.  RETURN IMMEDIATELY IF you:  Develop a sudden, severe headache  Develop confusion or become poorly responsive or faint  Develop a fever above 100.27F or problem breathing  Have a change in speech, vision, swallowing, or understanding  Develop new weakness, numbness, tingling, incoordination in your arms or legs  Have a seizure  Please return if you have any other emergent concerns.  Additional Information:  Your vital signs today were: BP 107/75 (BP Location: Left Arm)   Pulse 60   Temp 98.9 F (37.2 C) (Oral)   Resp 16   Ht 5\' 1"  (1.549 m)   Wt 77.1 kg   LMP 06/22/2020   SpO2 100%   BMI 32.12 kg/m  If your blood pressure (BP) was elevated above 135/85 this visit, please have this repeated by your doctor within one month. --------------

## 2020-06-22 NOTE — ED Provider Notes (Signed)
[facility] EMERGENCY DEPARTMENT Provider Note   CSN: [id number] Arrival date & time: 06/22/20  8127     History Chief Complaint  Patient presents with  . Headache    Melanie Campbell is a 24 y.o. female.  Patient with history of WPW, asthma, and anxiety presenting today with intractable headache x 1 week. She reports that last Sunday 7/25 she experienced an episode of vomiting followed by a headache after the episode of vomiting. Patient states that vomiting started first and onset of HA was acute and severe shortly after onset.  No syncope or LOC.  She says the headache has been constant since it began on 7/25. She says the headache is bitemporal and across the front of her forehead, and does not radiate. She describes the headache as "tension" and says ibuprofen and a pain pill, that she thinks is oxycodone or hydrocodone from a friend, did not help either. She went to [facility] ED on 7/25 for these sx and left due to wait times. On 7/26 she presented to [facility] ED and was given ibuprofen which she states that she took for 2 days and did not relieve her pain so she stopped taking it. She presented to [facility] again on 7/29 for these sx and was treated in ED with decadron and toradol shot. She was given Reglan to take at home which she states improved her ability to keep food down and decreased her vomiting. She denies diplopia, visual disturbances, phonophobia. Admits to photophobia. Denies syncope, chest pain, shortness of breath, palpitations, current nausea or abdominal pain, neck tenderness or stiffness, difficulty ambulating, chills. Admits to night sweats. She denies history of headaches in the past. Denies family history of headaches, stroke, brain disease.         Past Medical History:  Diagnosis Date  . Anxiety   . Asthma   . WPW (Wolff-Parkinson-White syndrome)     There are no problems to display for this patient.   History reviewed.  No pertinent surgical history.   OB History   No obstetric history on file.     No family history on file.  Social History   Tobacco Use  . Smoking status: Never Smoker  . Smokeless tobacco: Never Used  Vaping Use  . Vaping Use: Never used  Substance Use Topics  . Alcohol use: Not Currently    Alcohol/week: 3.0 standard drinks    Types: 3 Cans of beer per week  . Drug use: Not Currently    Types: Marijuana    Home Medications Prior to Admission medications   Medication Sig Start Date End Date Taking? Authorizing Provider  albuterol (VENTOLIN HFA) 108 (90 Base) MCG/ACT inhaler Inhale 2 puffs into the lungs every 4 (four) hours as needed for wheezing.  06/30/17   [provider]  cetirizine (ZYRTEC ALLERGY) 10 MG tablet Take 1 tablet (10 mg total) by mouth daily. 03/31/20   Little, Ambrose Finland, MD  Fluticasone-Salmeterol (ADVAIR DISKUS) 250-50 MCG/DOSE AEPB Inhale 1 puff into the lungs every 12 (twelve) hours. 03/31/20   Little, Ambrose Finland, MD  ibuprofen (ADVIL) 800 MG tablet Take 1 tablet (800 mg total) by mouth 3 (three) times daily. 06/16/20   Arby Barrette, MD  metoCLOPramide (REGLAN) 10 MG tablet Take 1 tablet (10 mg total) by mouth every 12 (twelve) hours as needed for up to 5 doses (headache/nausea/vomting). 06/19/20   Curatolo, Adam, DO  omeprazole (PRILOSEC) 20 MG capsule Take  1 capsule (20 mg total) by mouth daily. 06/16/20   Arby Barrette, MD  predniSONE (DELTASONE) 20 MG tablet 3 tabs po day one, then 2 po daily x 4 days 06/09/20   Rolan Bucco, MD    Allergies    Azithromycin  Review of Systems   Review of Systems  Constitutional: Negative for fever.  HENT: Negative for congestion, dental problem, rhinorrhea and sinus pressure.   Eyes: Negative for photophobia, discharge, redness and visual disturbance.  Respiratory: Negative for shortness of breath.   Cardiovascular: Negative for chest pain.  Gastrointestinal: Positive for nausea and vomiting  (improved). Negative for abdominal pain.  Musculoskeletal: Negative for gait problem, neck pain and neck stiffness.  Skin: Negative for rash.  Neurological: Positive for headaches. Negative for syncope, speech difficulty, weakness, light-headedness and numbness.  Psychiatric/Behavioral: Negative for confusion.    Physical Exam Updated Vital Signs BP 107/75 (BP Location: Left Arm)   Pulse 60   Temp 98.9 F (37.2 C) (Oral)   Resp 16   Ht 5\' 1"  (1.549 m)   Wt 77.1 kg   LMP 06/22/2020   SpO2 100%   BMI 32.12 kg/m   Physical Exam Vitals and nursing note reviewed.  Constitutional:      Appearance: She is well-developed.  HENT:     Head: Normocephalic and atraumatic.     Right Ear: Tympanic membrane, ear canal and external ear normal.     Left Ear: Tympanic membrane, ear canal and external ear normal.     Nose: Nose normal.     Mouth/Throat:     Pharynx: Uvula midline.  Eyes:     General: Lids are normal.     Extraocular Movements:     Right eye: No nystagmus.     Left eye: No nystagmus.     Conjunctiva/sclera: Conjunctivae normal.     Pupils: Pupils are equal, round, and reactive to light.  Cardiovascular:     Rate and Rhythm: Normal rate and regular rhythm.  Pulmonary:     Effort: Pulmonary effort is normal.     Breath sounds: Normal breath sounds.  Abdominal:     Palpations: Abdomen is soft.     Tenderness: There is no abdominal tenderness.  Musculoskeletal:     Cervical back: Normal range of motion and neck supple. No tenderness or bony tenderness.  Skin:    General: Skin is warm and dry.  Neurological:     Mental Status: She is alert and oriented to person, place, and time.     GCS: GCS eye subscore is 4. GCS verbal subscore is 5. GCS motor subscore is 6.     Cranial Nerves: No cranial nerve deficit.     Sensory: No sensory deficit.     Coordination: Coordination normal.     Gait: Gait normal.     Deep Tendon Reflexes: Reflexes are normal and symmetric.      ED Results / Procedures / Treatments   Labs (all labs ordered are listed, but only abnormal results are displayed) Labs Reviewed  CBC - Abnormal; Notable for the following components:      Result Value   Hemoglobin 11.6 (*)    RDW 16.3 (*)    All other components within normal limits  BASIC METABOLIC PANEL    EKG None  Radiology CT Head Wo Contrast  Result Date: 06/22/2020 CLINICAL DATA:  Acute on chronic headache. EXAM: CT HEAD WITHOUT CONTRAST TECHNIQUE: Contiguous axial images were obtained from the base of the  skull through the vertex without intravenous contrast. COMPARISON:  08/25/2019; 05/11/2018; 08/25/2013 FINDINGS: Brain: Gray-white differentiation is maintained. No CT evidence of acute large territory infarct. No intraparenchymal or extra-axial mass or hemorrhage. Normal size and configuration of the ventricles and the basilar cisterns. No midline shift. Vascular: No hyperdense vessel or unexpected calcification. Skull: No displaced calvarial fracture Sinuses/Orbits: Limited visualization of the paranasal sinuses and mastoid air cells is normal. No air-fluid levels. Other: Regional soft tissues appear normal. IMPRESSION: Negative noncontrast head CT. Electronically Signed   By: Simonne Come M.D.   On: 06/22/2020 10:48    Procedures Procedures (including critical care time)  Medications Ordered in ED Medications  metoCLOPramide (REGLAN) injection 10 mg (10 mg Intravenous Given 06/22/20 1052)  diphenhydrAMINE (BENADRYL) injection 25 mg (25 mg Intravenous Given 06/22/20 1051)  sodium chloride 0.9 % bolus 1,000 mL (1,000 mLs Intravenous New Bag/Given 06/22/20 1050)    ED Course  I have reviewed the triage vital signs and the nursing notes.  Pertinent labs & imaging results that were available during my care of the patient were reviewed by me and considered in my medical decision making (see chart for details).  Patient seen and examined. Work-up initiated. Medications  ordered.   Vital signs reviewed and are as follows: BP 107/75 (BP Location: Left Arm)   Pulse 60   Temp 98.9 F (37.2 C) (Oral)   Resp 16   Ht 5\' 1"  (1.549 m)   Wt 77.1 kg   LMP 06/22/2020   SpO2 100%   BMI 32.12 kg/m   Patient has a reassuring exam, however several other considerations which are concerning.  This is her fourth visit to the emergency department with ongoing headache which she states is constant.  She does not typically get headaches and this is an unusual problem for her.  In addition, she reports that her headache began relatively acutely after throwing up and was severe.  1:05 PM I discussed CT findings with patient at bedside.  Discussed that we do not see any masses, bleeding, or blood vessel abnormalities today.  Discussed that imaging test cannot always rule out definitively, bleeding in the brain leading to headache.  We discussed risks/benefits of lumbar puncture procedure.  Patient agrees that the risks of this procedure are likely not worth the benefits for her at this time and she is comfortable with deferring any further evaluation at this point.  Headache improved from 10/10 to 7/10.  Patient is stating that she is ready to go home.  I offered Toradol and Decadron prior to discharge, and she agrees.  She does not have a current PCP but states that she has recently tried to establish care, needs to follow-up with that.  Encouraged PCP follow-up.    MDM Rules/Calculators/A&P                          Patient with new-onset HA, possibly thunderclap onset after vomiting. This is 4th ED visit without significant improvement.  For this reason, CT head, CT angio were performed today to evaluate for mass, bleeding, aneurysm.  Fortunately these were negative.  Patient without other high-risk features of headache including: altered mental status, accompanying seizure, headache with exertion, age > 87, history of immunocompromise, neck or shoulder pain, fever, use of  anticoagulation, family history of spontaneous SAH, concomitant drug use, toxic exposure.   Patient has a normal complete neurological exam, normal vital signs, normal level of consciousness, no signs  of meningismus, is well-appearing/non-toxic appearing, no signs of trauma.  No dangerous or life-threatening conditions suspected or identified by history, physical exam, and by work-up. No indications for hospitalization identified.     Final Clinical Impression(s) / ED Diagnoses Final diagnoses:  Acute nonintractable headache, unspecified headache type    Rx / DC Orders ED Discharge Orders    None       Renne CriglerGeiple, Avantika Shere, PA-C 06/22/20 1310    Gwyneth SproutPlunkett, Whitney, MD 06/22/20 1635

## 2020-06-22 NOTE — ED Notes (Signed)
Pt discharged to home. Discharge instructions have been discussed with patient and/or family members. Pt verbally acknowledges understanding d/c instructions, and endorses comprehension to checkout at registration before leaving.  °

## 2020-07-23 ENCOUNTER — Emergency Department (HOSPITAL_BASED_OUTPATIENT_CLINIC_OR_DEPARTMENT_OTHER)
Admission: EM | Admit: 2020-07-23 | Discharge: 2020-07-23 | Disposition: A | Discharge status: Home / Self Care | Payer: Medicaid Other | Attending: Emergency Medicine | Admitting: Emergency Medicine

## 2020-07-23 ENCOUNTER — Encounter: Payer: Self-pay | Admitting: Neurology

## 2020-07-23 ENCOUNTER — Other Ambulatory Visit: Payer: Self-pay

## 2020-07-23 ENCOUNTER — Encounter (HOSPITAL_BASED_OUTPATIENT_CLINIC_OR_DEPARTMENT_OTHER): Payer: Self-pay | Admitting: *Deleted

## 2020-07-23 DIAGNOSIS — Z79899 Other long term (current) drug therapy: Secondary | ICD-10-CM | POA: Insufficient documentation

## 2020-07-23 DIAGNOSIS — R0982 Postnasal drip: Secondary | ICD-10-CM | POA: Insufficient documentation

## 2020-07-23 DIAGNOSIS — J45909 Unspecified asthma, uncomplicated: Secondary | ICD-10-CM | POA: Insufficient documentation

## 2020-07-23 DIAGNOSIS — R112 Nausea with vomiting, unspecified: Secondary | ICD-10-CM | POA: Insufficient documentation

## 2020-07-23 DIAGNOSIS — R519 Headache, unspecified: Secondary | ICD-10-CM | POA: Insufficient documentation

## 2020-07-23 MED ORDER — AMOXICILLIN-POT CLAVULANATE 875-125 MG PO TABS
1.0000 | ORAL_TABLET | Freq: Two times a day (BID) | ORAL | 0 refills | Status: AC
Start: 2020-07-23 — End: ?

## 2020-07-23 MED ORDER — BUTALBITAL-APAP-CAFFEINE 50-325-40 MG PO TABS: 1.0000 | ORAL_TABLET | Freq: Four times a day (QID) | ORAL | 0 refills | Status: AC | PRN

## 2020-07-23 MED ORDER — DIPHENHYDRAMINE HCL 50 MG/ML IJ SOLN
25.0000 mg | Freq: Once | INTRAMUSCULAR | Status: DC
  Filled 2020-07-23: qty 1

## 2020-07-23 MED ORDER — ONDANSETRON 4 MG PO TBDP
ORAL_TABLET | ORAL | 0 refills | Status: AC
Start: 2020-07-23 — End: ?

## 2020-07-23 MED ORDER — DEXAMETHASONE 6 MG PO TABS
10.0000 mg | ORAL_TABLET | Freq: Once | ORAL | Status: AC
  Administered 2020-07-23: 10 mg via ORAL
  Filled 2020-07-23: qty 1

## 2020-07-23 MED ORDER — PROCHLORPERAZINE EDISYLATE 10 MG/2ML IJ SOLN
10.0000 mg | Freq: Once | INTRAMUSCULAR | Status: AC
  Administered 2020-07-23: 10 mg via INTRAMUSCULAR
  Filled 2020-07-23: qty 2

## 2020-07-23 MED FILL — AMOX-CLAV 875-125 MG TABLET: 875-125 | 7 days supply | Qty: 14 | Fill #0

## 2020-07-23 MED FILL — BUTALB-ACETAMIN-CAFF 50-325: 50-325-40 | 2 days supply | Qty: 10 | Fill #0

## 2020-07-23 MED FILL — ONDANSETRON ODT 4MG TBDP: 4 | 3 days supply | Qty: 20 | Fill #0

## 2020-07-23 NOTE — ED Triage Notes (Signed)
Headache since June 11, 2020

## 2020-07-23 NOTE — Discharge Instructions (Signed)
It is frustrating to have a symptom that has not improved.  I'm going to treat you with a course of antibiotics in case this is a sinus infection.  The neurologist should call you to try and set up an appointment.  There is a family doctor's office upstairs he can try and call them and see if you can be seen as a new patient.  There is also a hotline number that you can try where they can try and establish you with a primary care provider.  Please return for worsening headache fever one-sided numbness or weakness difficulty with speech or swallowing.  There is a phenomenon called rebound headache.  Please do not take medication unless you do have a headache.

## 2020-07-23 NOTE — ED Provider Notes (Signed)
[facility] EMERGENCY DEPARTMENT Provider Note   CSN: [id number] Arrival date & time: 07/23/20  4627     History No chief complaint on file.   Melanie Campbell is a 24 y.o. female.  24 yo F with a chief complaints of a headache.  This is been going on now for about a month.  Patient states it's to the frontal region and sometimes radiates down to the maxillary area.  Bilateral.  Nothing seems to make it better or worse.  She has tried NSAIDs and narcotics with minimal improvement.  Denies trauma.  Denies neck pain.  Denies cough congestion or fever.  Has tried antihistamines.  Denies ear pain.  Denies sore throat.  Denies one-sided numbness or weakness denies difficulty speech or swallowing.  The history is provided by the patient.  Illness Severity:  Moderate Onset quality:  Gradual Duration:  4 weeks Timing:  Constant Progression:  Unchanged Chronicity:  New Associated symptoms: headaches, nausea and vomiting   Associated symptoms: no chest pain, no congestion, no fever, no myalgias, no rhinorrhea, no shortness of breath and no wheezing        Past Medical History:  Diagnosis Date  . Anxiety   . Asthma   . WPW (Wolff-Parkinson-White syndrome)     There are no problems to display for this patient.   No past surgical history on file.   OB History   No obstetric history on file.     No family history on file.  Social History   Tobacco Use  . Smoking status: Never Smoker  . Smokeless tobacco: Never Used  Vaping Use  . Vaping Use: Never used  Substance Use Topics  . Alcohol use: Not Currently    Alcohol/week: 3.0 standard drinks    Types: 3 Cans of beer per week  . Drug use: Not Currently    Types: Marijuana    Home Medications Prior to Admission medications   Medication Sig Start Date End Date Taking? Authorizing Provider  albuterol (VENTOLIN HFA) 108 (90 Base) MCG/ACT inhaler Inhale 2 puffs into the lungs every 4 (four) hours as needed for  wheezing.  06/30/17   [provider]  amoxicillin-clavulanate (AUGMENTIN) 875-125 MG tablet Take 1 tablet by mouth every 12 (twelve) hours. 07/23/20   Melene Plan, DO  butalbital-acetaminophen-caffeine (FIORICET) [phone] MG tablet Take 1-2 tablets by mouth every 6 (six) hours as needed for headache. 07/23/20 07/23/21  Melene Plan, DO  cetirizine (ZYRTEC ALLERGY) 10 MG tablet Take 1 tablet (10 mg total) by mouth daily. 03/31/20   Little, Ambrose Finland, MD  Fluticasone-Salmeterol (ADVAIR DISKUS) 250-50 MCG/DOSE AEPB Inhale 1 puff into the lungs every 12 (twelve) hours. 03/31/20   Little, Ambrose Finland, MD  ibuprofen (ADVIL) 800 MG tablet Take 1 tablet (800 mg total) by mouth 3 (three) times daily. 06/16/20   Arby Barrette, MD  metoCLOPramide (REGLAN) 10 MG tablet Take 1 tablet (10 mg total) by mouth every 12 (twelve) hours as needed for up to 5 doses (headache/nausea/vomting). 06/19/20   Curatolo, Adam, DO  omeprazole (PRILOSEC) 20 MG capsule Take 1 capsule (20 mg total) by mouth daily. 06/16/20   Arby Barrette, MD  ondansetron (ZOFRAN ODT) 4 MG disintegrating tablet 4mg  ODT q4 hours prn nausea/vomit 07/23/20   09/22/20, DO  predniSONE (DELTASONE) 20 MG tablet 3 tabs po day one, then 2 po daily x 4 days 06/09/20   06/11/20, MD    Allergies    Azithromycin  Review of  Systems   Review of Systems  Constitutional: Negative for chills and fever.  HENT: Negative for congestion and rhinorrhea.   Eyes: Negative for redness and visual disturbance.  Respiratory: Negative for shortness of breath and wheezing.   Cardiovascular: Negative for chest pain and palpitations.  Gastrointestinal: Positive for nausea and vomiting.  Genitourinary: Negative for dysuria and urgency.  Musculoskeletal: Negative for arthralgias and myalgias.  Skin: Negative for pallor and wound.  Neurological: Positive for headaches. Negative for dizziness.    Physical Exam Updated Vital Signs BP 122/65 (BP Location: Left  Arm)   Pulse 81   Temp 99.1 F (37.3 C) (Oral)   Resp 15   Ht 5\' 1"  (1.549 m)   Wt 74.5 kg   SpO2 100%   BMI 31.05 kg/m   Physical Exam Vitals and nursing note reviewed.  Constitutional:      General: She is not in acute distress.    Appearance: She is well-developed. She is not diaphoretic.  HENT:     Head: Normocephalic and atraumatic.     Comments: Swollen turbinates, posterior nasal drip, no noted sinus ttp, tm normal bilaterally.   Eyes:     Pupils: Pupils are equal, round, and reactive to light.  Cardiovascular:     Rate and Rhythm: Normal rate and regular rhythm.     Heart sounds: No murmur heard.  No friction rub. No gallop.   Pulmonary:     Effort: Pulmonary effort is normal.     Breath sounds: No wheezing or rales.  Abdominal:     General: There is no distension.     Palpations: Abdomen is soft.     Tenderness: There is no abdominal tenderness.  Musculoskeletal:        General: No tenderness.     Cervical back: Normal range of motion and neck supple.  Skin:    General: Skin is warm and dry.  Neurological:     Mental Status: She is alert and oriented to person, place, and time.     GCS: GCS eye subscore is 4. GCS verbal subscore is 5. GCS motor subscore is 6.     Cranial Nerves: Cranial nerves are intact.     Motor: Motor function is intact.     Coordination: Coordination is intact.     Gait: Gait is intact.     Comments: Benign neuro exam  Psychiatric:        Behavior: Behavior normal.     ED Results / Procedures / Treatments   Labs (all labs ordered are listed, but only abnormal results are displayed) Labs Reviewed - No data to display  EKG None  Radiology No results found.  Procedures Procedures (including critical care time)  Medications Ordered in ED Medications  prochlorperazine (COMPAZINE) injection 10 mg (has no administration in time range)  diphenhydrAMINE (BENADRYL) injection 25 mg (has no administration in time range)    dexamethasone (DECADRON) tablet 10 mg (has no administration in time range)    ED Course  I have reviewed the triage vital signs and the nursing notes.  Pertinent labs & imaging results that were available during my care of the patient were reviewed by me and considered in my medical decision making (see chart for details).    MDM Rules/Calculators/A&P                          24 yo F with a chief complaints of frontal and maxillary headaches.  Has been going on for about a month.  Has had now for visits to the ED.  Has had head imaging in a prior visit.  She is well-appearing and nontoxic.  Does not to be in any acute distress.  Pain level appears to be low objectively.  She has a benign neurologic exam.  Will treat with a headache cocktail here.  Decadron.  Started on a course of antibiotics as the pain is overlying sinuses.  Referral to neurology.  7:54 AM:  I have discussed the diagnosis/risks/treatment options with the patient and believe the pt to be eligible for discharge home to follow-up with PCP, neuro. We also discussed returning to the ED immediately if new or worsening sx occur. We discussed the sx which are most concerning (e.g., sudden worsening pain, fever, inability to tolerate by mouth, stroke s/sx) that necessitate immediate return. Medications administered to the patient during their visit and any new prescriptions provided to the patient are listed below.  Medications given during this visit Medications  prochlorperazine (COMPAZINE) injection 10 mg (has no administration in time range)  diphenhydrAMINE (BENADRYL) injection 25 mg (has no administration in time range)  dexamethasone (DECADRON) tablet 10 mg (has no administration in time range)     The patient appears reasonably screen and/or stabilized for discharge and I doubt any other medical condition or other [facility] requiring further screening, evaluation, or treatment in the ED at this time prior to discharge.    Final Clinical Impression(s) / ED Diagnoses Final diagnoses:  Frontal headache    Rx / DC Orders ED Discharge Orders         Ordered    amoxicillin-clavulanate (AUGMENTIN) 875-125 MG tablet  Every 12 hours        07/23/20 0747    butalbital-acetaminophen-caffeine (FIORICET) 50-325-40 MG tablet  Every 6 hours PRN        07/23/20 0748    ondansetron (ZOFRAN ODT) 4 MG disintegrating tablet        07/23/20 0748    Ambulatory referral to Neurology        07/23/20 0748           Melene Plan, DO 07/23/20 [phone]

## 2020-10-07 NOTE — Progress Notes (Deleted)
NEUROLOGY CONSULTATION NOTE  Melanie Campbell MRN: [id number] DOB: 06-Oct-1996  Referring provider: Melene Plan, DO (ED referral) Primary care provider: No PCP  Reason for consult:  headaches   Subjective:  Melanie Campbell is a 24 year old ***-handed female with asthma, Wolff-Parkinson-White syndrome and anxiety who presents for headaches.  History supplemented by ED notes.  CT/CTA of head from August personally reviewed.   ***.  She was seen in the ED on 6 occasions for headache between 06/15/2020 to 07/23/2020.  CT and CTA of head on 06/22/2020 were normal.  She has also been to the ED several occasions for asthma exacerbations.    PAST MEDICAL HISTORY: Past Medical History:  Diagnosis Date  . Anxiety   . Asthma   . WPW (Wolff-Parkinson-White syndrome)     PAST SURGICAL HISTORY: No past surgical history on file.  MEDICATIONS: Current Outpatient Medications on File Prior to Visit  Medication Sig Dispense Refill  . albuterol (VENTOLIN HFA) 108 (90 Base) MCG/ACT inhaler Inhale 2 puffs into the lungs every 4 (four) hours as needed for wheezing.     Marland Kitchen amoxicillin-clavulanate (AUGMENTIN) 875-125 MG tablet Take 1 tablet by mouth every 12 (twelve) hours. 14 tablet 0  . butalbital-acetaminophen-caffeine (FIORICET) 50-325-40 MG tablet Take 1-2 tablets by mouth every 6 (six) hours as needed for headache. 10 tablet 0  . ondansetron (ZOFRAN ODT) 4 MG disintegrating tablet 4mg  ODT q4 hours prn nausea/vomit 20 tablet 0   No current facility-administered medications on file prior to visit.    ALLERGIES: Allergies  Allergen Reactions  . Azithromycin Nausea And Vomiting    FAMILY HISTORY: No family history on file. ***.  SOCIAL HISTORY: Social History   Socioeconomic History  . Marital status: Single    Spouse name: Not on file  . Number of children: Not on file  . Years of education: Not on file  . Highest education level: Not on file  Occupational History  . Not on file   Tobacco Use  . Smoking status: Never Smoker  . Smokeless tobacco: Never Used  Vaping Use  . Vaping Use: Never used  Substance and Sexual Activity  . Alcohol use: Not Currently    Alcohol/week: 3.0 standard drinks    Types: 3 Cans of beer per week  . Drug use: Not Currently    Types: Marijuana  . Sexual activity: Not on file  Other Topics Concern  . Not on file  Social History Narrative  . Not on file   Social Determinants of Health   Financial Resource Strain:   . Difficulty of Paying Living Expenses: Not on file  Food Insecurity:   . Worried About in the Last Year: Not on file  . Ran Out of Food in the Last Year: Not on file  Transportation Needs:   . Lack of Transportation (Medical): Not on file  . Lack of Transportation (Non-Medical): Not on file  Physical Activity:   . Days of Exercise per Week: Not on file  . Minutes of Exercise per Session: Not on file  Stress:   . Feeling of Stress : Not on file  Social Connections:   . Frequency of Communication with Friends and Family: Not on file  . Frequency of Social Gatherings with Friends and Family: Not on file  . Attends Religious Services: Not on file  . Active Member of Clubs or Organizations: Not on file  . Attends Programme researcher, broadcasting/film/video Meetings: Not on file  .  Marital Status: Not on file  Intimate Partner Violence:   . Fear of Current or Ex-Partner: Not on file  . Emotionally Abused: Not on file  . Physically Abused: Not on file  . Sexually Abused: Not on file    Objective:  *** General: No acute distress.  Patient appears well-groomed.   Head:  Normocephalic/atraumatic Eyes:  fundi examined but not visualized Neck: supple, no paraspinal tenderness, full range of motion Back: No paraspinal tenderness Heart: regular rate and rhythm Lungs: Clear to auscultation bilaterally. Vascular: No carotid bruits. Neurological Exam: Mental status: alert and oriented to person, place, and time, recent  and remote memory intact, fund of knowledge intact, attention and concentration intact, speech fluent and not dysarthric, language intact. Cranial nerves: CN I: not tested CN II: pupils equal, round and reactive to light, visual fields intact CN III, IV, VI:  full range of motion, no nystagmus, no ptosis CN V: facial sensation intact. CN VII: upper and lower face symmetric CN VIII: hearing intact CN IX, X: gag intact, uvula midline CN XI: sternocleidomastoid and trapezius muscles intact CN XII: tongue midline Bulk & Tone: normal, no fasciculations. Motor:  muscle strength 5/5 throughout Sensation:  Pinprick, temperature and vibratory sensation intact. Deep Tendon Reflexes:  2+ throughout,  toes downgoing.   Finger to nose testing:  Without dysmetria.   Heel to shin:  Without dysmetria.   Gait:  Normal station and stride.  Romberg negative.  Assessment/Plan:   ***    Thank you for allowing me to take part in the care of this patient.  Shon Millet, DO

## 2020-10-08 ENCOUNTER — Ambulatory Visit: Payer: Medicaid Other | Admitting: Neurology

## 2021-01-11 ENCOUNTER — Other Ambulatory Visit: Payer: Self-pay

## 2021-01-11 ENCOUNTER — Encounter (HOSPITAL_BASED_OUTPATIENT_CLINIC_OR_DEPARTMENT_OTHER): Payer: Self-pay | Admitting: Emergency Medicine

## 2021-01-11 ENCOUNTER — Emergency Department (HOSPITAL_BASED_OUTPATIENT_CLINIC_OR_DEPARTMENT_OTHER)
Admission: EM | Admit: 2021-01-11 | Discharge: 2021-01-11 | Disposition: A | Discharge status: Home / Self Care | Payer: Medicaid Other | Attending: Emergency Medicine | Admitting: Emergency Medicine

## 2021-01-11 ENCOUNTER — Emergency Department (HOSPITAL_BASED_OUTPATIENT_CLINIC_OR_DEPARTMENT_OTHER): Payer: Medicaid Other

## 2021-01-11 DIAGNOSIS — J45901 Unspecified asthma with (acute) exacerbation: Secondary | ICD-10-CM | POA: Insufficient documentation

## 2021-01-11 MED ORDER — ALBUTEROL (5 MG/ML) CONTINUOUS INHALATION SOLN
10.0000 mg/h | INHALATION_SOLUTION | RESPIRATORY_TRACT | Status: DC
  Filled 2021-01-11: qty 20

## 2021-01-11 MED ORDER — ALBUTEROL SULFATE HFA 108 (90 BASE) MCG/ACT IN AERS: 2.0000 | INHALATION_SPRAY | RESPIRATORY_TRACT | 1 refills | Status: DC | PRN

## 2021-01-11 MED ORDER — ALBUTEROL (5 MG/ML) CONTINUOUS INHALATION SOLN
INHALATION_SOLUTION | RESPIRATORY_TRACT | Status: AC
  Administered 2021-01-11: 10 mg/h via RESPIRATORY_TRACT
  Filled 2021-01-11: qty 20

## 2021-01-11 MED ORDER — DEXAMETHASONE SODIUM PHOSPHATE 10 MG/ML IJ SOLN
10.0000 mg | Freq: Once | INTRAMUSCULAR | Status: AC
  Administered 2021-01-11: 10 mg via INTRAMUSCULAR
  Filled 2021-01-11: qty 1

## 2021-01-11 MED ORDER — ALBUTEROL SULFATE HFA 108 (90 BASE) MCG/ACT IN AERS
2.0000 | INHALATION_SPRAY | RESPIRATORY_TRACT | Status: DC | PRN
  Administered 2021-01-11: 2 via RESPIRATORY_TRACT

## 2021-01-11 MED ORDER — ALBUTEROL SULFATE (2.5 MG/3ML) 0.083% IN NEBU
2.5000 mg | INHALATION_SOLUTION | Freq: Four times a day (QID) | RESPIRATORY_TRACT | 12 refills | Status: DC | PRN
Start: 2021-01-11 — End: 2021-08-16

## 2021-01-11 MED ORDER — ALBUTEROL SULFATE HFA 108 (90 BASE) MCG/ACT IN AERS
INHALATION_SPRAY | RESPIRATORY_TRACT | Status: AC
  Filled 2021-01-11: qty 6.7

## 2021-01-11 MED ORDER — PREDNISONE 20 MG PO TABS
40.0000 mg | ORAL_TABLET | Freq: Every day | ORAL | 0 refills | Status: AC
Start: 2021-01-11 — End: 2021-01-15

## 2021-01-11 NOTE — Discharge Instructions (Addendum)
You were evaluated in the Emergency Department and after careful evaluation, we did not find any emergent condition requiring admission or further testing in the hospital.  Your exam/testing today was overall reassuring.  Symptoms seem to be due to a flare of your asthma.  Please take the prednisone medication as directed to help your lungs recover.  Use the inhaler and/or nebulizer treatments as needed for wheezing.  Please return to the Emergency Department if you experience any worsening of your condition.  Thank you for allowing [place] to be a part of your care.

## 2021-01-11 NOTE — ED Notes (Signed)
Pt discharged to home. Discharge instructions have been discussed with patient and/or family members. Pt verbally acknowledges understanding d/c instructions, and endorses comprehension to checkout at registration before leaving.  °

## 2021-01-11 NOTE — ED Triage Notes (Signed)
Pt states hx of asthma, exacerbation woke her from sleep around 0200. Pt is out of her inhaler. Pt tachypnic, tripoding in bed. RR 36 with audible wheezing. EDP and RRT at bedside during triage. Denies recent illness, cough or covid exposure.

## 2021-01-11 NOTE — ED Provider Notes (Signed)
[facility] Emergency Department Provider Note MRN:  [id number]  Arrival date & time: 01/11/21     Chief Complaint   Shortness of Breath   History of Present Illness   Melanie Campbell is a 25 y.o. year-old female with a history of asthma, WPW presenting to the ED with chief complaint of shortness of breath.  Shortness of breath beginning suddenly at 2 AM this morning.  Feels similar to prior asthma flareups.  Thinks that it is due to the change in weather recently.  Denies any cold-like symptoms, no fever or cough.  No chest pain.  No leg pain or swelling recently, no history of blood clots.  Review of Systems  A complete 10 system review of systems was obtained and all systems are negative except as noted in the HPI and PMH.   Patient's Health History    Past Medical History:  Diagnosis Date  . Anxiety   . Asthma   . WPW (Wolff-Parkinson-White syndrome)     History reviewed. No pertinent surgical history.  History reviewed. No pertinent family history.  Social History   Socioeconomic History  . Marital status: Single    Spouse name: Not on file  . Number of children: Not on file  . Years of education: Not on file  . Highest education level: Not on file  Occupational History  . Not on file  Tobacco Use  . Smoking status: Never Smoker  . Smokeless tobacco: Never Used  Vaping Use  . Vaping Use: Never used  Substance and Sexual Activity  . Alcohol use: Not Currently    Alcohol/week: 3.0 standard drinks    Types: 3 Cans of beer per week  . Drug use: Not Currently    Types: Marijuana    Comment: edibles  . Sexual activity: Not on file  Other Topics Concern  . Not on file  Social History Narrative  . Not on file   Social Determinants of Health   Financial Resource Strain: Not on file  Food Insecurity: Not on file  Transportation Needs: Not on file  Physical Activity: Not on file  Stress: Not on file  Social Connections: Not on  file  Intimate Partner Violence: Not on file     Physical Exam   Vitals:   01/11/21 0532  Pulse: 80  Resp: (!) 36  Temp: 98.3 F (36.8 C)  SpO2: 97%    CONSTITUTIONAL: Well-appearing, NAD NEURO:  Alert and oriented x 3, no focal deficits EYES:  eyes equal and reactive ENT/NECK:  no LAD, no JVD CARDIO: Regular rate, well-perfused, normal S1 and S2 PULM: Diffuse wheezing, tachypneic GI/GU:  normal bowel sounds, non-distended, non-tender MSK/SPINE:  No gross deformities, no edema SKIN:  no rash, atraumatic PSYCH:  Appropriate speech and behavior  *Additional and/or pertinent findings included in MDM below  Diagnostic and Interventional Summary    EKG Interpretation  Date/Time:  Sunday January 11 2021 05:47:13 EST Ventricular Rate:  66 PR Interval:    QRS Duration: 89 QT Interval:  415 QTC Calculation: 412 R Axis:   82 Text Interpretation: Sinus rhythm Confirmed by Kennis Carina [phone]) on 01/11/2021 6:31:34 AM      Labs Reviewed - No data to display  DG Chest Port 1 View  Final Result      Medications  albuterol (PROVENTIL,VENTOLIN) solution continuous neb (10 mg/hr Nebulization New Bag/Given 01/11/21 0538)  albuterol (VENTOLIN HFA) 108 (90 Base) MCG/ACT inhaler 2 puff (2 puffs Inhalation Given 01/11/21  0544)  dexamethasone (DECADRON) injection 10 mg (10 mg Intramuscular Given 01/11/21 0544)     Procedures  /  Critical Care Procedures  ED Course and Medical Decision Making  I have reviewed the triage vital signs, the nursing notes, and pertinent available records from the EMR.  Listed above are laboratory and imaging tests that I personally ordered, reviewed, and interpreted and then considered in my medical decision making (see below for details).  Seems consistent with asthma exacerbation.  Has run out of her medications recently.  Will obtain screening EKG and chest x-ray given history of WPW.  Providing duo nebs, steroids, will reassess.  Suspect will be  able to discharge.     On reassessment patient feeling much better, exam improved, appropriate for discharge  Elmer Sow. Pilar Plate, MD [facility] Emergency Medicine [facility] [email or web address]  Final Clinical Impressions(s) / ED Diagnoses     ICD-10-CM   1. Exacerbation of asthma, unspecified asthma severity, unspecified whether persistent  J45.901     ED Discharge Orders         Ordered    albuterol (VENTOLIN HFA) 108 (90 Base) MCG/ACT inhaler  Every 4 hours PRN        01/11/21 0652    albuterol (PROVENTIL) (2.5 MG/3ML) 0.083% nebulizer solution  Every 6 hours PRN        01/11/21 0652    predniSONE (DELTASONE) 20 MG tablet  Daily        01/11/21 0653           Discharge Instructions Discussed with and Provided to Patient:     Discharge Instructions     You were evaluated in the Emergency Department and after careful evaluation, we did not find any emergent condition requiring admission or further testing in the hospital.  Your exam/testing today was overall reassuring.  Symptoms seem to be due to a flare of your asthma.  Please take the prednisone medication as directed to help your lungs recover.  Use the inhaler and/or nebulizer treatments as needed for wheezing.  Please return to the Emergency Department if you experience any worsening of your condition.  Thank you for allowing [place] to be a part of your care.        Sabas Sous, MD 01/11/21 [phone]

## 2021-06-08 ENCOUNTER — Emergency Department (HOSPITAL_BASED_OUTPATIENT_CLINIC_OR_DEPARTMENT_OTHER)
Admission: EM | Admit: 2021-06-08 | Discharge: 2021-06-08 | Disposition: A | Discharge status: Home / Self Care | Payer: Medicaid Other | Attending: Emergency Medicine | Admitting: Emergency Medicine

## 2021-06-08 ENCOUNTER — Encounter (HOSPITAL_BASED_OUTPATIENT_CLINIC_OR_DEPARTMENT_OTHER): Payer: Self-pay | Admitting: Emergency Medicine

## 2021-06-08 ENCOUNTER — Other Ambulatory Visit: Payer: Self-pay

## 2021-06-08 DIAGNOSIS — N898 Other specified noninflammatory disorders of vagina: Secondary | ICD-10-CM

## 2021-06-08 DIAGNOSIS — B9689 Other specified bacterial agents as the cause of diseases classified elsewhere: Secondary | ICD-10-CM | POA: Insufficient documentation

## 2021-06-08 DIAGNOSIS — J029 Acute pharyngitis, unspecified: Secondary | ICD-10-CM | POA: Insufficient documentation

## 2021-06-08 DIAGNOSIS — Z20822 Contact with and (suspected) exposure to covid-19: Secondary | ICD-10-CM | POA: Insufficient documentation

## 2021-06-08 DIAGNOSIS — J45909 Unspecified asthma, uncomplicated: Secondary | ICD-10-CM | POA: Insufficient documentation

## 2021-06-08 DIAGNOSIS — N760 Acute vaginitis: Secondary | ICD-10-CM | POA: Insufficient documentation

## 2021-06-08 LAB — GROUP A STREP BY PCR: Group A Strep by PCR: NOT DETECTED

## 2021-06-08 LAB — URINALYSIS, ROUTINE W REFLEX MICROSCOPIC
Bilirubin Urine: NEGATIVE
Glucose, UA: NEGATIVE mg/dL
Ketones, ur: NEGATIVE mg/dL
Nitrite: NEGATIVE
Protein, ur: NEGATIVE mg/dL
Specific Gravity, Urine: 1.025 (ref 1.005–1.030)
pH: 6 (ref 5.0–8.0)

## 2021-06-08 LAB — URINALYSIS, MICROSCOPIC (REFLEX)

## 2021-06-08 LAB — WET PREP, GENITAL
Sperm: NONE SEEN
Trich, Wet Prep: NONE SEEN
Yeast Wet Prep HPF POC: NONE SEEN

## 2021-06-08 LAB — SARS CORONAVIRUS 2 (TAT 6-24 HRS): SARS Coronavirus 2: NEGATIVE

## 2021-06-08 LAB — PREGNANCY, URINE: Preg Test, Ur: NEGATIVE

## 2021-06-08 MED ORDER — METRONIDAZOLE 500 MG PO TABS: 500.0000 mg | ORAL_TABLET | Freq: Two times a day (BID) | ORAL | 0 refills | Status: AC

## 2021-06-08 MED ORDER — KETOROLAC TROMETHAMINE 60 MG/2ML IM SOLN
30.0000 mg | Freq: Once | INTRAMUSCULAR | Status: AC
  Administered 2021-06-08: 30 mg via INTRAMUSCULAR
  Filled 2021-06-08: qty 2

## 2021-06-08 MED ORDER — CEFTRIAXONE SODIUM 500 MG IJ SOLR
500.0000 mg | Freq: Once | INTRAMUSCULAR | Status: AC
  Administered 2021-06-08: 500 mg via INTRAMUSCULAR
  Filled 2021-06-08: qty 500

## 2021-06-08 MED ORDER — LIDOCAINE HCL (PF) 1 % IJ SOLN
1.0000 mL | Freq: Once | INTRAMUSCULAR | Status: AC
  Administered 2021-06-08: 1 mL
  Filled 2021-06-08: qty 5

## 2021-06-08 MED ORDER — DOXYCYCLINE HYCLATE 100 MG PO CAPS: 100.0000 mg | ORAL_CAPSULE | Freq: Two times a day (BID) | ORAL | 0 refills | Status: AC

## 2021-06-08 NOTE — Discharge Instructions (Addendum)
Take antibiotics. Check my chart for gonorrhead and chlamydia results. Do no consume any alcohol while taking the prescribed flagyl. Take tylenol or motrin for your throat pain.

## 2021-06-08 NOTE — ED Provider Notes (Signed)
[facility] EMERGENCY DEPARTMENT Provider Note   CSN: [id number] Arrival date & time: 06/08/21  2355     History Chief Complaint  Patient presents with   Vaginal Discharge   Sore Throat    Melanie Campbell is a 25 y.o. female.  Presents emergency room with concern for 2 different concerns.  She states that she has been having sore throat for the past couple days, mild to moderate, worse with swallowing.  Is able to swallow without any significant difficulty however.  No change in appetite or diet.  No neck swelling or neck stiffness.  No fevers or cough.  Since yesterday patient has been experiencing a vaginal discharge.  States it is white, somewhat itchy and foul-smelling.  She is not currently sexually active but does have history of being sexually active, female partners only.  Denies known exposures to STDs, denies history of STDs. HPI     Past Medical History:  Diagnosis Date   Anxiety    Asthma    WPW (Wolff-Parkinson-White syndrome)     There are no problems to display for this patient.   History reviewed. No pertinent surgical history.   OB History   No obstetric history on file.     No family history on file.  Social History   Tobacco Use   Smoking status: Never   Smokeless tobacco: Never  Vaping Use   Vaping Use: Never used  Substance Use Topics   Alcohol use: Not Currently    Alcohol/week: 3.0 standard drinks    Types: 3 Cans of beer per week   Drug use: Not Currently    Types: Marijuana    Comment: edibles    Home Medications Prior to Admission medications   Medication Sig Start Date End Date Taking? Authorizing Provider  doxycycline (VIBRAMYCIN) 100 MG capsule Take 1 capsule (100 mg total) by mouth 2 (two) times daily for 7 days. 06/08/21 06/15/21 Yes Dlynn Ranes, [place] Livings, MD  metroNIDAZOLE (FLAGYL) 500 MG tablet Take 1 tablet (500 mg total) by mouth 2 (two) times daily. 06/08/21  Yes Milagros Loll, MD  albuterol (PROVENTIL) (2.5  MG/3ML) 0.083% nebulizer solution Take 3 mLs (2.5 mg total) by nebulization every 6 (six) hours as needed for wheezing or shortness of breath. 01/11/21   Sabas Sous, MD  albuterol (VENTOLIN HFA) 108 (90 Base) MCG/ACT inhaler Inhale 2 puffs into the lungs every 4 (four) hours as needed for wheezing. 01/11/21   Sabas Sous, MD  amoxicillin-clavulanate (AUGMENTIN) 875-125 MG tablet Take 1 tablet by mouth every 12 (twelve) hours. 07/23/20   Melene Plan, DO  butalbital-acetaminophen-caffeine (FIORICET) [phone] MG tablet Take 1-2 tablets by mouth every 6 (six) hours as needed for headache. 07/23/20 07/23/21  Melene Plan, DO  ondansetron (ZOFRAN ODT) 4 MG disintegrating tablet 4mg  ODT q4 hours prn nausea/vomit 07/23/20   09/22/20, DO    Allergies    Azithromycin  Review of Systems   Review of Systems  Constitutional:  Negative for chills and fever.  HENT:  Positive for sore throat. Negative for ear pain.   Eyes:  Negative for pain and visual disturbance.  Respiratory:  Negative for cough and shortness of breath.   Cardiovascular:  Negative for chest pain and palpitations.  Gastrointestinal:  Negative for abdominal pain and vomiting.  Genitourinary:  Positive for vaginal discharge. Negative for dysuria and hematuria.  Musculoskeletal:  Negative for arthralgias and back pain.  Skin:  Negative for color change and rash.  Neurological:  Negative for seizures and syncope.  All other systems reviewed and are negative.  Physical Exam Updated Vital Signs BP 110/82 (BP Location: Right Arm)   Pulse 81   Temp 98.1 F (36.7 C) (Oral)   Resp 16   Ht 5\' 2"  (1.575 m)   Wt 72.1 kg   LMP 05/18/2021   SpO2 100%   BMI 29.08 kg/m   Physical Exam Vitals and nursing note reviewed.  Constitutional:      General: She is not in acute distress.    Appearance: She is well-developed.  HENT:     Head: Normocephalic and atraumatic.     Mouth/Throat:     Mouth: Mucous membranes are moist. No oral lesions.      Pharynx: Oropharynx is clear. No pharyngeal swelling, oropharyngeal exudate or posterior oropharyngeal erythema.     Tonsils: No tonsillar exudate.  Eyes:     Conjunctiva/sclera: Conjunctivae normal.  Neck:     Comments: Range of motion normal Cardiovascular:     Rate and Rhythm: Normal rate and regular rhythm.     Heart sounds: No murmur heard. Pulmonary:     Effort: Pulmonary effort is normal. No respiratory distress.     Breath sounds: Normal breath sounds.  Abdominal:     Palpations: Abdomen is soft.     Tenderness: There is no abdominal tenderness.  Genitourinary:    Comments: Normal-appearing external genitalia, thick white discharge appreciated on speculum exam, cervix is normal, no CMT, no adnexal tenderness bilaterally Musculoskeletal:     Cervical back: Normal range of motion and neck supple.  Skin:    General: Skin is warm and dry.  Neurological:     Mental Status: She is alert.    ED Results / Procedures / Treatments   Labs (all labs ordered are listed, but only abnormal results are displayed) Labs Reviewed  WET PREP, GENITAL - Abnormal; Notable for the following components:      Result Value   Clue Cells Wet Prep HPF POC PRESENT (*)    WBC, Wet Prep HPF POC MODERATE (*)    All other components within normal limits  URINALYSIS, ROUTINE W REFLEX MICROSCOPIC - Abnormal; Notable for the following components:   Hgb urine dipstick SMALL (*)    Leukocytes,Ua SMALL (*)    All other components within normal limits  URINALYSIS, MICROSCOPIC (REFLEX) - Abnormal; Notable for the following components:   Bacteria, UA RARE (*)    All other components within normal limits  GROUP A STREP BY PCR  SARS CORONAVIRUS 2 (TAT 6-24 HRS)  PREGNANCY, URINE  GC/CHLAMYDIA PROBE AMP ([place]) NOT AT [facility]    EKG None  Radiology No results found.  Procedures Procedures   Medications Ordered in ED Medications  cefTRIAXone (ROCEPHIN) injection 500 mg (500 mg Intramuscular  Given 06/08/21 0846)  ketorolac (TORADOL) injection 30 mg (30 mg Intramuscular Given 06/08/21 0846)  lidocaine (PF) (XYLOCAINE) 1 % injection 1 mL (1 mL Other Given 06/08/21 0846)    ED Course  I have reviewed the triage vital signs and the nursing notes.  Pertinent labs & imaging results that were available during my care of the patient were reviewed by me and considered in my medical decision making (see chart for details).  Clinical Course as of 06/09/21 1050  Mon Jun 08, 2021  Jun 10, 2021 0488 RN chaperone for pelvic [RD]    Clinical Course User Index [RD] Herbert Seta, MD   MDM Rules/Calculators/A&P  25 year old presents to ER with concern for sore throat as well as vaginal discharge.  Regarding sore throat, her throat exam is relatively benign.  Did not appreciate any significant erythema or exudates.  Rapid strep was negative.  Denies any recent oral sex.  No neck pain or stiffness or swelling.  Suspect may be viral in etiology.  Regarding vaginal discharge, thick white discharge appreciated on exam, notably no cervical motion tenderness and no adnexal tenderness.  No associated abdominal pain.  Wet prep concerning for bacterial vaginosis.  Send gonorrhea and Chlamydia testing.  Offered HIV and syphilis but patient declined.  She was interested in receiving empiric treatment for gonorrhea and chlamydia.  Provided Rocephin in ER, provided Rx for Flagyl for BV and Doxy for chlamydia treatment.  Instructed patient to follow-up in MyChart to review results of her gonorrhea/chlamydia testing.      After the discussed management above, the patient was determined to be safe for discharge.  The patient was in agreement with this plan and all questions regarding their care were answered.  ED return precautions were discussed and the patient will return to the ED with any significant worsening of condition.  Final Clinical Impression(s) / ED Diagnoses Final  diagnoses:  BV (bacterial vaginosis)  Vaginal discharge    Rx / DC Orders ED Discharge Orders          Ordered    metroNIDAZOLE (FLAGYL) 500 MG tablet  2 times daily        06/08/21 0918    doxycycline (VIBRAMYCIN) 100 MG capsule  2 times daily        06/08/21 2440             Milagros Loll, MD 06/09/21 1050

## 2021-06-08 NOTE — ED Notes (Signed)
Introduced self to patient, patient here for sore throat x 2 days and vaginal itching with discharge.

## 2021-06-08 NOTE — ED Notes (Signed)
ED Provider at bedside with Female RN for pelvic examination

## 2021-06-08 NOTE — ED Triage Notes (Signed)
Reports vaginal discharge with itching and sore throat that started yesterday.  Using monistat at home with no relief.

## 2021-06-09 LAB — GC/CHLAMYDIA PROBE AMP (~~LOC~~) NOT AT ARMC
Chlamydia: NEGATIVE
Comment: NEGATIVE
Comment: NORMAL
Neisseria Gonorrhea: NEGATIVE

## 2021-08-16 ENCOUNTER — Emergency Department (HOSPITAL_BASED_OUTPATIENT_CLINIC_OR_DEPARTMENT_OTHER)
Admission: EM | Admit: 2021-08-16 | Discharge: 2021-08-16 | Disposition: A | Discharge status: Home / Self Care | Payer: Medicaid Other | Attending: Emergency Medicine | Admitting: Emergency Medicine

## 2021-08-16 ENCOUNTER — Other Ambulatory Visit: Payer: Self-pay

## 2021-08-16 ENCOUNTER — Encounter (HOSPITAL_BASED_OUTPATIENT_CLINIC_OR_DEPARTMENT_OTHER): Payer: Self-pay | Admitting: Emergency Medicine

## 2021-08-16 DIAGNOSIS — J45901 Unspecified asthma with (acute) exacerbation: Secondary | ICD-10-CM | POA: Insufficient documentation

## 2021-08-16 MED ORDER — ALBUTEROL SULFATE HFA 108 (90 BASE) MCG/ACT IN AERS
8.0000 | INHALATION_SPRAY | Freq: Once | RESPIRATORY_TRACT | Status: AC
  Administered 2021-08-16: 8 via RESPIRATORY_TRACT
  Filled 2021-08-16: qty 6.7

## 2021-08-16 MED ORDER — IPRATROPIUM BROMIDE HFA 17 MCG/ACT IN AERS
2.0000 | INHALATION_SPRAY | Freq: Once | RESPIRATORY_TRACT | Status: AC
  Administered 2021-08-16: 2 via RESPIRATORY_TRACT
  Filled 2021-08-16: qty 12.9

## 2021-08-16 MED ORDER — PREDNISONE 20 MG PO TABS
40.0000 mg | ORAL_TABLET | Freq: Every day | ORAL | 0 refills | Status: AC
  Filled 2021-08-16: qty 10, 5d supply, fill #0

## 2021-08-16 MED ORDER — ALBUTEROL SULFATE (2.5 MG/3ML) 0.083% IN NEBU
2.5000 mg | INHALATION_SOLUTION | Freq: Four times a day (QID) | RESPIRATORY_TRACT | 5 refills | Status: AC | PRN
Start: 2021-08-16 — End: ?
  Filled 2021-08-16 – 2022-07-23 (×2): qty 75, 7d supply, fill #0

## 2021-08-16 MED ORDER — ALBUTEROL SULFATE HFA 108 (90 BASE) MCG/ACT IN AERS
2.0000 | INHALATION_SPRAY | RESPIRATORY_TRACT | 1 refills | Status: AC | PRN
  Filled 2021-08-16: qty 8.5, 17d supply, fill #0
  Filled 2022-07-23: qty 6.7, 17d supply, fill #0

## 2021-08-16 NOTE — ED Triage Notes (Signed)
Pt states she woke this morning (~30 min ago) with [facility] that felt like her typical asthma attack. Pt reports recent move and loss of her home medications to treat. Inspiratory and expiratory wheezes, oxygen saturation 97-99%. RRT at bedside on arrival. Denies other sx.

## 2021-08-16 NOTE — ED Provider Notes (Signed)
[facility]GENC[facility]PT [facility] Emergency Department Provider Note MRN:  [id number]  Arrival date & time: 08/16/21     Chief Complaint   Asthma   History of Present Illness   Melanie Campbell is a 25 y.o. year-old female with a history of WPW, asthma presenting to the ED with chief complaint of asthma.  Wheezing and tightness in the chest for about 1 day.  Explains that she moved and lost her albuterol for her nebulizer and so has not been able to medicate at home.  Thinks that the weather change has caused a flare of her asthma.  Denies any fever or cough, no chest pain, no abdominal pain, no other complaints.  Symptoms are mild to moderate, constant, no exacerbating or alleviating factors.  Review of Systems  A complete 10 system review of systems was obtained and all systems are negative except as noted in the HPI and PMH.   Patient's Health History    Past Medical History:  Diagnosis Date   Anxiety    Asthma    WPW (Wolff-Parkinson-White syndrome)     History reviewed. No pertinent surgical history.  No family history on file.  Social History   Socioeconomic History   Marital status: Single    Spouse name: Not on file   Number of children: Not on file   Years of education: Not on file   Highest education level: Not on file  Occupational History   Not on file  Tobacco Use   Smoking status: Never   Smokeless tobacco: Never  Vaping Use   Vaping Use: Never used  Substance and Sexual Activity   Alcohol use: Not Currently    Alcohol/week: 3.0 standard drinks    Types: 3 Cans of beer per week   Drug use: Not Currently    Types: Marijuana    Comment: edibles   Sexual activity: Not on file  Other Topics Concern   Not on file  Social History Narrative   Not on file   Social Determinants of Health   Financial Resource Strain: Not on file  Food Insecurity: Not on file  Transportation Needs: Not on file  Physical Activity: Not on file  Stress: Not on file   Social Connections: Not on file  Intimate Partner Violence: Not on file     Physical Exam   Vitals:   08/16/21 0343 08/16/21 0400  BP:  105/73  Pulse:  83  Resp:  14  SpO2: 100% 100%    CONSTITUTIONAL: Well-appearing, NAD NEURO:  Alert and oriented x 3, no focal deficits EYES:  eyes equal and reactive ENT/NECK:  no LAD, no JVD CARDIO: Regular rate, well-perfused, normal S1 and S2 PULM: Diffuse wheezing, no increased work of breathing GI/GU:  normal bowel sounds, non-distended, non-tender MSK/SPINE:  No gross deformities, no edema SKIN:  no rash, atraumatic PSYCH:  Appropriate speech and behavior  *Additional and/or pertinent findings included in MDM below  Diagnostic and Interventional Summary    EKG Interpretation  Date/Time:    Ventricular Rate:    PR Interval:    QRS Duration:   QT Interval:    QTC Calculation:   R Axis:     Text Interpretation:         Labs Reviewed - No data to display  No orders to display    Medications  albuterol (VENTOLIN HFA) 108 (90 Base) MCG/ACT inhaler 8 puff (8 puffs Inhalation Given 08/16/21 0342)  ipratropium (ATROVENT HFA) inhaler 2 puff (2  puffs Inhalation Given 08/16/21 0342)     Procedures  /  Critical Care Procedures  ED Course and Medical Decision Making  I have reviewed the triage vital signs, the nursing notes, and pertinent available records from the EMR.  Listed above are laboratory and imaging tests that I personally ordered, reviewed, and interpreted and then considered in my medical decision making (see below for details).  Patient doing much better after albuterol and Atrovent, no acute distress, all seems consistent with asthma exacerbation.  Nothing to suggest PE or other cardiopulmonary process.  Appropriate for discharge.       Elmer Sow. Pilar Plate, MD [facility] Emergency Medicine [facility] [email or web address]  Final Clinical Impressions(s) / ED Diagnoses     ICD-10-CM   1. Mild  asthma with exacerbation, unspecified whether persistent  J45.901       ED Discharge Orders          Ordered    albuterol (PROVENTIL) (2.5 MG/3ML) 0.083% nebulizer solution  Every 6 hours PRN        08/16/21 0419    albuterol (VENTOLIN HFA) 108 (90 Base) MCG/ACT inhaler  Every 4 hours PRN        08/16/21 0419    predniSONE (DELTASONE) 20 MG tablet  Daily        08/16/21 0419             Discharge Instructions Discussed with and Provided to Patient:    Discharge Instructions      You were evaluated in the Emergency Department and after careful evaluation, we did not find any emergent condition requiring admission or further testing in the hospital.  Your exam/testing today was overall reassuring.  Symptoms likely due to a flare of your asthma.  We are refilling your medicines, take as needed.  Also recommend taking the prednisone as directed.  Please return to the Emergency Department if you experience any worsening of your condition.  Thank you for allowing [place] to be a part of your care.        Sabas Sous, MD 08/16/21 [id number]

## 2021-08-16 NOTE — Discharge Instructions (Addendum)
You were evaluated in the Emergency Department and after careful evaluation, we did not find any emergent condition requiring admission or further testing in the hospital.  Your exam/testing today was overall reassuring.  Symptoms likely due to a flare of your asthma.  We are refilling your medicines, take as needed.  Also recommend taking the prednisone as directed.  Please return to the Emergency Department if you experience any worsening of your condition.  Thank you for allowing [place] to be a part of your care.

## 2021-08-17 ENCOUNTER — Other Ambulatory Visit (HOSPITAL_BASED_OUTPATIENT_CLINIC_OR_DEPARTMENT_OTHER): Payer: Self-pay

## 2021-08-24 ENCOUNTER — Other Ambulatory Visit (HOSPITAL_BASED_OUTPATIENT_CLINIC_OR_DEPARTMENT_OTHER): Payer: Self-pay

## 2021-10-04 ENCOUNTER — Encounter (HOSPITAL_BASED_OUTPATIENT_CLINIC_OR_DEPARTMENT_OTHER): Payer: Self-pay | Admitting: Emergency Medicine

## 2021-10-04 ENCOUNTER — Other Ambulatory Visit: Payer: Self-pay

## 2021-10-04 ENCOUNTER — Emergency Department (HOSPITAL_BASED_OUTPATIENT_CLINIC_OR_DEPARTMENT_OTHER)
Admission: EM | Admit: 2021-10-04 | Discharge: 2021-10-04 | Disposition: A | Discharge status: Home / Self Care | Payer: Medicaid Other | Attending: Emergency Medicine | Admitting: Emergency Medicine

## 2021-10-04 DIAGNOSIS — J45909 Unspecified asthma, uncomplicated: Secondary | ICD-10-CM | POA: Insufficient documentation

## 2021-10-04 DIAGNOSIS — B9689 Other specified bacterial agents as the cause of diseases classified elsewhere: Secondary | ICD-10-CM | POA: Insufficient documentation

## 2021-10-04 DIAGNOSIS — N760 Acute vaginitis: Secondary | ICD-10-CM | POA: Insufficient documentation

## 2021-10-04 LAB — PREGNANCY, URINE: Preg Test, Ur: NEGATIVE

## 2021-10-04 LAB — URINALYSIS, MICROSCOPIC (REFLEX)

## 2021-10-04 LAB — URINALYSIS, ROUTINE W REFLEX MICROSCOPIC
Bilirubin Urine: NEGATIVE
Glucose, UA: NEGATIVE mg/dL
Ketones, ur: NEGATIVE mg/dL
Nitrite: NEGATIVE
Protein, ur: NEGATIVE mg/dL
Specific Gravity, Urine: 1.015 (ref 1.005–1.030)
pH: 6 (ref 5.0–8.0)

## 2021-10-04 LAB — WET PREP, GENITAL
Sperm: NONE SEEN
Trich, Wet Prep: NONE SEEN
Yeast Wet Prep HPF POC: NONE SEEN

## 2021-10-04 MED ORDER — METRONIDAZOLE 500 MG PO TABS: 500.0000 mg | ORAL_TABLET | Freq: Two times a day (BID) | ORAL | 0 refills | Status: AC

## 2021-10-04 NOTE — ED Notes (Signed)
Pt to bathroom to attempt to provide urine specimen

## 2021-10-04 NOTE — ED Provider Notes (Signed)
[facility] EMERGENCY DEPARTMENT Provider Note   CSN: [id number] Arrival date & time: 10/04/21  0725     History Chief Complaint  Patient presents with   Vaginal Discharge    Melanie Campbell is a 25 y.o. female.  HPI Gradual onset of vaginal discharge for about 3 days.  Patient reports there is some itching.  No significant amount of burning.  No abdominal pain.  Patient reports she has not been sexually active for 6 months.  Typically she is sexually active with women only but has distant history of sexual activity with men.  She reports she was treated for STI about 6 months ago when she was with a partner.  She reports however at that time she tested negative for STI and had bacterial vaginosis.  She thinks symptoms may be due to changing washing detergent.    Past Medical History:  Diagnosis Date   Anxiety    Asthma    WPW (Wolff-Parkinson-White syndrome)     There are no problems to display for this patient.   History reviewed. No pertinent surgical history.   OB History   No obstetric history on file.     History reviewed. No pertinent family history.  Social History   Tobacco Use   Smoking status: Never   Smokeless tobacco: Never  Vaping Use   Vaping Use: Never used  Substance Use Topics   Alcohol use: Not Currently    Alcohol/week: 3.0 standard drinks    Types: 3 Cans of beer per week   Drug use: Yes    Types: Marijuana    Comment: edibles    Home Medications Prior to Admission medications   Medication Sig Start Date End Date Taking? Authorizing Provider  metroNIDAZOLE (FLAGYL) 500 MG tablet Take 1 tablet (500 mg total) by mouth 2 (two) times daily. One po bid x 7 days 10/04/21  Yes Jemia Fata, Lebron Conners, MD  albuterol (PROVENTIL) (2.5 MG/3ML) 0.083% nebulizer solution Take 3 mLs (2.5 mg total) by nebulization every 6 (six) hours as needed for wheezing or shortness of breath. 08/16/21   Sabas Sous, MD  albuterol (VENTOLIN HFA) 108 (90 Base)  MCG/ACT inhaler Inhale 2 puffs by mouth into the lungs every 4 (four) hours as needed for wheezing. 08/16/21   Sabas Sous, MD  amoxicillin-clavulanate (AUGMENTIN) 875-125 MG tablet Take 1 tablet by mouth every 12 (twelve) hours. 07/23/20   Melene Plan, DO  metroNIDAZOLE (FLAGYL) 500 MG tablet Take 1 tablet (500 mg total) by mouth 2 (two) times daily. 06/08/21   Milagros Loll, MD  ondansetron (ZOFRAN ODT) 4 MG disintegrating tablet 4mg  ODT q4 hours prn nausea/vomit 07/23/20   09/22/20, DO    Allergies    Azithromycin  Review of Systems   Review of Systems 10 systems reviewed negative except as per HPI Physical Exam Updated Vital Signs BP 107/63 (BP Location: Right Arm)   Pulse 60   Temp 98.3 F (36.8 C)   Resp 16   Ht 5\' 2"  (1.575 m)   Wt 72.6 kg   LMP 09/05/2021   SpO2 100%   BMI 29.26 kg/m   Physical Exam Constitutional:      Appearance: Normal appearance.  HENT:     Mouth/Throat:     Pharynx: Oropharynx is clear.  Eyes:     Extraocular Movements: Extraocular movements intact.  Cardiovascular:     Rate and Rhythm: Normal rate and regular rhythm.  Pulmonary:     Effort: Pulmonary  effort is normal.     Breath sounds: Normal breath sounds.  Abdominal:     General: There is no distension.     Palpations: Abdomen is soft.     Tenderness: There is no abdominal tenderness. There is no guarding.  Genitourinary:    Comments: Normal external female genitalia.  No external lesions or irritation of the mucosa.  moderate to large amount of whitish-yellow discharge in the vaginal vault.  No blood.  Cervix normal in appearance.  Bimanual exam no uterine tenderness no adnexal tenderness Musculoskeletal:        General: No swelling. Normal range of motion.     Right lower leg: No edema.     Left lower leg: No edema.  Skin:    General: Skin is warm and dry.  Neurological:     General: No focal deficit present.     Mental Status: She is alert and oriented to person, place, and  time.     Coordination: Coordination normal.  Psychiatric:        Mood and Affect: Mood normal.    ED Results / Procedures / Treatments   Labs (all labs ordered are listed, but only abnormal results are displayed) Labs Reviewed  WET PREP, GENITAL - Abnormal; Notable for the following components:      Result Value   Clue Cells Wet Prep HPF POC PRESENT (*)    WBC, Wet Prep HPF POC MANY (*)    All other components within normal limits  URINALYSIS, ROUTINE W REFLEX MICROSCOPIC - Abnormal; Notable for the following components:   Color, Urine STRAW (*)    APPearance HAZY (*)    Hgb urine dipstick TRACE (*)    Leukocytes,Ua SMALL (*)    All other components within normal limits  URINALYSIS, MICROSCOPIC (REFLEX) - Abnormal; Notable for the following components:   Bacteria, UA MANY (*)    All other components within normal limits  PREGNANCY, URINE  GC/CHLAMYDIA PROBE AMP ([place]) NOT AT [facility]    EKG None  Radiology No results found.  Procedures Procedures   Medications Ordered in ED Medications - No data to display  ED Course  I have reviewed the triage vital signs and the nursing notes.  Pertinent labs & imaging results that were available during my care of the patient were reviewed by me and considered in my medical decision making (see chart for details).    MDM Rules/Calculators/A&P                           Patient having problems with recurrent vaginal discharge.  Patient denies being sexually active in the past 6 months.  Wet prep consistent with bacterial vaginosis.  Patient does have significant discharge in the vaginal vault.  Pelvic exam is nontender.  At this time no suspicion for PID.  Patient made aware that GC and Chlamydia testing are pending.  Recommendations for follow-up with gynecology as this has become a recurrent symptom for the patient.  Information for bacterial vaginosis included in discharge instructions. Final Clinical Impression(s) / ED  Diagnoses Final diagnoses:  BV (bacterial vaginosis)    Rx / DC Orders ED Discharge Orders          Ordered    metroNIDAZOLE (FLAGYL) 500 MG tablet  2 times daily        10/04/21 1003             Arby Barrette, MD 10/04/21 1005

## 2021-10-04 NOTE — Discharge Instructions (Signed)
1.  Schedule follow-up appointment with a gynecologist as soon as possible for recheck. 2.  You have been given a prescription for Flagyl.  This is to treat bacterial vaginosis.  Reviewed the instructions attached to your discharge summary. 3.  Testing for gonorrhea and chlamydia have been done.  These results will not be return for 1 to 3 days.  At this time you do not have symptoms that suggest you need treatment.  If your tests are positive you will need treatment.  If your tests are negative no further action is needed.

## 2021-10-04 NOTE — ED Triage Notes (Signed)
Pt arrives pov with c/o vaginal d/c with itching x 2 days.

## 2021-10-05 LAB — GC/CHLAMYDIA PROBE AMP (~~LOC~~) NOT AT ARMC
Chlamydia: NEGATIVE
Comment: NEGATIVE
Comment: NORMAL
Neisseria Gonorrhea: NEGATIVE

## 2021-10-14 ENCOUNTER — Other Ambulatory Visit: Payer: Self-pay

## 2021-10-14 ENCOUNTER — Encounter (HOSPITAL_BASED_OUTPATIENT_CLINIC_OR_DEPARTMENT_OTHER): Payer: Self-pay

## 2021-10-14 ENCOUNTER — Emergency Department (HOSPITAL_BASED_OUTPATIENT_CLINIC_OR_DEPARTMENT_OTHER): Payer: Self-pay

## 2021-10-14 ENCOUNTER — Emergency Department (HOSPITAL_BASED_OUTPATIENT_CLINIC_OR_DEPARTMENT_OTHER)
Admission: EM | Admit: 2021-10-14 | Discharge: 2021-10-14 | Disposition: A | Discharge status: Home / Self Care | Payer: Self-pay | Attending: Emergency Medicine | Admitting: Emergency Medicine

## 2021-10-14 DIAGNOSIS — Z7951 Long term (current) use of inhaled steroids: Secondary | ICD-10-CM | POA: Insufficient documentation

## 2021-10-14 DIAGNOSIS — J4541 Moderate persistent asthma with (acute) exacerbation: Secondary | ICD-10-CM | POA: Insufficient documentation

## 2021-10-14 DIAGNOSIS — J101 Influenza due to other identified influenza virus with other respiratory manifestations: Secondary | ICD-10-CM | POA: Insufficient documentation

## 2021-10-14 DIAGNOSIS — Z20822 Contact with and (suspected) exposure to covid-19: Secondary | ICD-10-CM | POA: Insufficient documentation

## 2021-10-14 DIAGNOSIS — R6889 Other general symptoms and signs: Secondary | ICD-10-CM

## 2021-10-14 LAB — CBC WITH DIFFERENTIAL/PLATELET
Abs Immature Granulocytes: 0.01 10*3/uL (ref 0.00–0.07)
Basophils Absolute: 0 10*3/uL (ref 0.0–0.1)
Basophils Relative: 1 %
Eosinophils Absolute: 0 10*3/uL (ref 0.0–0.5)
Eosinophils Relative: 0 %
HCT: 40.2 % (ref 36.0–46.0)
Hemoglobin: 13.2 g/dL (ref 12.0–15.0)
Immature Granulocytes: 0 %
Lymphocytes Relative: 22 %
Lymphs Abs: 0.6 10*3/uL — ABNORMAL LOW (ref 0.7–4.0)
MCH: 28.4 pg (ref 26.0–34.0)
MCHC: 32.8 g/dL (ref 30.0–36.0)
MCV: 86.5 fL (ref 80.0–100.0)
Monocytes Absolute: 0.6 10*3/uL (ref 0.1–1.0)
Monocytes Relative: 20 %
Neutro Abs: 1.6 10*3/uL — ABNORMAL LOW (ref 1.7–7.7)
Neutrophils Relative %: 57 %
Platelets: 255 10*3/uL (ref 150–400)
RBC: 4.65 MIL/uL (ref 3.87–5.11)
RDW: 15.5 % (ref 11.5–15.5)
WBC: 2.8 10*3/uL — ABNORMAL LOW (ref 4.0–10.5)
nRBC: 0 % (ref 0.0–0.2)

## 2021-10-14 LAB — RESP PANEL BY RT-PCR (FLU A&B, COVID) ARPGX2
Influenza A by PCR: POSITIVE — AB
Influenza B by PCR: NEGATIVE
SARS Coronavirus 2 by RT PCR: NEGATIVE

## 2021-10-14 LAB — BASIC METABOLIC PANEL
Anion gap: 8 (ref 5–15)
BUN: 8 mg/dL (ref 6–20)
CO2: 23 mmol/L (ref 22–32)
Calcium: 9.4 mg/dL (ref 8.9–10.3)
Chloride: 104 mmol/L (ref 98–111)
Creatinine, Ser: 1.07 mg/dL — ABNORMAL HIGH (ref 0.44–1.00)
GFR, Estimated: >60 mL/min (ref >60)
Glucose, Bld: 94 mg/dL (ref 70–99)
Potassium: 4 mmol/L (ref 3.5–5.1)
Sodium: 135 mmol/L (ref 135–145)

## 2021-10-14 MED ORDER — ALBUTEROL SULFATE HFA 108 (90 BASE) MCG/ACT IN AERS: 2.0000 | INHALATION_SPRAY | RESPIRATORY_TRACT | Status: DC | PRN

## 2021-10-14 MED ORDER — IPRATROPIUM-ALBUTEROL 0.5-2.5 (3) MG/3ML IN SOLN
3.0000 mL | Freq: Once | RESPIRATORY_TRACT | Status: AC
  Administered 2021-10-14: 3 mL via RESPIRATORY_TRACT
  Filled 2021-10-14: qty 3

## 2021-10-14 MED ORDER — ALBUTEROL SULFATE HFA 108 (90 BASE) MCG/ACT IN AERS
2.0000 | INHALATION_SPRAY | Freq: Four times a day (QID) | RESPIRATORY_TRACT | Status: DC
  Administered 2021-10-14: 2 via RESPIRATORY_TRACT
  Filled 2021-10-14: qty 6.7

## 2021-10-14 NOTE — ED Notes (Signed)
No acute distress noted upon this RN's departure of patient. Verified discharge paperwork with name and DOB. Vital signs stable. Patient taken to checkout window. Discharge paperwork discussed with patient. No further questions voiced upon discharge.  ° °

## 2021-10-14 NOTE — ED Notes (Signed)
Pt aware of needed urine sample. Pt verbalizes understanding to use call light when able to provide sample.

## 2021-10-14 NOTE — ED Provider Notes (Signed)
[facility] EMERGENCY DEPARTMENT Provider Note   CSN: [id number] Arrival date & time: 10/14/21  [id number]     History Chief Complaint  Patient presents with   Shortness of Breath    Melanie Campbell is a 25 y.o. female.  Patient with a history of flulike illness since Sunday.  Patient also has a history of asthma.  This is been aggravating her asthma.  She went through all of her albuterol inhaler.  Still has nebulizer treatments at home.  Complaint is shortness of breath cough headache congestion.  Cough is productive.  No nausea vomiting or diarrhea.      Past Medical History:  Diagnosis Date   Anxiety    Asthma    WPW (Wolff-Parkinson-White syndrome)     There are no problems to display for this patient.   History reviewed. No pertinent surgical history.   OB History   No obstetric history on file.     No family history on file.  Social History   Tobacco Use   Smoking status: Never   Smokeless tobacco: Never  Vaping Use   Vaping Use: Never used  Substance Use Topics   Alcohol use: Not Currently    Alcohol/week: 3.0 standard drinks    Types: 3 Cans of beer per week   Drug use: Yes    Types: Marijuana    Comment: edibles    Home Medications Prior to Admission medications   Medication Sig Start Date End Date Taking? Authorizing Provider  albuterol (PROVENTIL) (2.5 MG/3ML) 0.083% nebulizer solution Take 3 mLs (2.5 mg total) by nebulization every 6 (six) hours as needed for wheezing or shortness of breath. 08/16/21   Maudie Flakes, MD  albuterol (VENTOLIN HFA) 108 (90 Base) MCG/ACT inhaler Inhale 2 puffs by mouth into the lungs every 4 (four) hours as needed for wheezing. 08/16/21   Maudie Flakes, MD  amoxicillin-clavulanate (AUGMENTIN) 875-125 MG tablet Take 1 tablet by mouth every 12 (twelve) hours. 07/23/20   Deno Etienne, DO  metroNIDAZOLE (FLAGYL) 500 MG tablet Take 1 tablet (500 mg total) by mouth 2 (two) times daily. 06/08/21   Lucrezia Starch,  MD  metroNIDAZOLE (FLAGYL) 500 MG tablet Take 1 tablet (500 mg total) by mouth 2 (two) times daily. One po bid x 7 days 10/04/21   Charlesetta Shanks, MD  ondansetron [facility] ODT) 4 MG disintegrating tablet 4mg  ODT q4 hours prn nausea/vomit 07/23/20   Deno Etienne, DO    Allergies    Azithromycin  Review of Systems   Review of Systems  Constitutional:  Positive for fever. Negative for chills.  HENT:  Positive for congestion. Negative for ear pain and sore throat.   Eyes:  Negative for pain and visual disturbance.  Respiratory:  Positive for cough and shortness of breath.   Cardiovascular:  Positive for chest pain. Negative for palpitations.  Gastrointestinal:  Negative for abdominal pain and vomiting.  Genitourinary:  Negative for dysuria and hematuria.  Musculoskeletal:  Positive for myalgias. Negative for arthralgias and back pain.  Skin:  Negative for color change and rash.  Neurological:  Negative for seizures and syncope.  All other systems reviewed and are negative.  Physical Exam Updated Vital Signs BP (!) 117/97 (BP Location: Left Arm)   Pulse 87   Temp 99.1 F (37.3 C) (Oral)   Resp 18   Ht 1.575 m (5\' 2" )   Wt 70.3 kg   LMP 10/10/2021   SpO2 98%   BMI 28.35 kg/m  Physical Exam Vitals and nursing note reviewed.  Constitutional:      General: She is not in acute distress.    Appearance: Normal appearance. She is well-developed.  HENT:     Head: Normocephalic and atraumatic.     Mouth/Throat:     Mouth: Mucous membranes are moist.  Eyes:     Extraocular Movements: Extraocular movements intact.     Conjunctiva/sclera: Conjunctivae normal.     Pupils: Pupils are equal, round, and reactive to light.  Cardiovascular:     Rate and Rhythm: Normal rate and regular rhythm.     Heart sounds: No murmur heard. Pulmonary:     Effort: Pulmonary effort is normal. No respiratory distress.     Breath sounds: Wheezing present. No rhonchi or rales.  Abdominal:     Palpations:  Abdomen is soft.     Tenderness: There is no abdominal tenderness.  Musculoskeletal:        General: No swelling.     Cervical back: Normal range of motion and neck supple.  Skin:    General: Skin is warm and dry.     Capillary Refill: Capillary refill takes less than 2 seconds.  Neurological:     General: No focal deficit present.     Mental Status: She is alert and oriented to person, place, and time.     Cranial Nerves: No cranial nerve deficit.     Sensory: No sensory deficit.     Motor: No weakness.  Psychiatric:        Mood and Affect: Mood normal.    ED Results / Procedures / Treatments   Labs (all labs ordered are listed, but only abnormal results are displayed) Labs Reviewed  RESP PANEL BY RT-PCR (FLU A&B, COVID) ARPGX2  CBC WITH DIFFERENTIAL/PLATELET  PREGNANCY, URINE  BASIC METABOLIC PANEL    EKG EKG Interpretation  Date/Time:  Wednesday October 14 2021 08:48:59 EST Ventricular Rate:  89 PR Interval:  126 QRS Duration: 97 QT Interval:  354 QTC Calculation: 431 R Axis:   59 Text Interpretation: Sinus rhythm Confirmed by Vanetta Mulders [phone]) on 10/14/2021 8:59:16 AM  Radiology No results found.  Procedures Procedures   Medications Ordered in ED Medications  albuterol (VENTOLIN HFA) 108 (90 Base) MCG/ACT inhaler 2 puff (has no administration in time range)  ipratropium-albuterol (DUONEB) 0.5-2.5 (3) MG/3ML nebulizer solution 3 mL (3 mLs Nebulization Given 10/14/21 0941)    ED Course  I have reviewed the triage vital signs and the nursing notes.  Pertinent labs & imaging results that were available during my care of the patient were reviewed by me and considered in my medical decision making (see chart for details).    MDM Rules/Calculators/A&P                           Patient with flulike symptoms.  Is wheezing.  So we will treat with nebulizer treatment albuterol and Atrovent get chest x-ray we will test for COVID and flu.  Also get basic  labs.  And establish an IV in case she needs IV steroids.  EKG without any acute findings.  Chest pain seems to be more related to the asthma and cough.  Flu testing positive COVID testing negative.  Mild leukopenia with a white count of 2.8.  Otherwise labs without significant abnormalities.  Chest x-ray negative for pneumonia.  Will have patient treat herself with over-the-counter cold and flu medications.  We will have her  use albuterol inhaler or nebulizer every 6 hours.  Patient will be given albuterol inhaler to go with.  Work note provided.  Following the albuterol Atrovent treatment here wheezing has resolved.   Final Clinical Impression(s) / ED Diagnoses Final diagnoses:  Flu-like symptoms  Moderate persistent asthma with exacerbation    Rx / DC Orders ED Discharge Orders     None        Fredia Sorrow, MD 10/14/21 1156

## 2021-10-14 NOTE — ED Triage Notes (Signed)
Pt presents to ED from home C/O CP, SOB, headache, coongestion, productive cough since Sunday. Pt reports hx asthma, taking home meds without relief.

## 2021-10-14 NOTE — Discharge Instructions (Addendum)
Using resolved with nebulizer treatment.  Test positive for influenza A.  Treat with over-the-counter cold and flu medications.  Return for any new or worse symptoms.  Chest x-ray negative for pneumonia.  Use your albuterol inhaler or nebulizer every 6 hours.

## 2021-10-14 NOTE — ED Notes (Signed)
Pt requesting medication for headache. Secure chat sent to MD.

## 2021-10-14 NOTE — ED Notes (Signed)
Portable Xray at bedside.

## 2022-03-16 ENCOUNTER — Encounter (HOSPITAL_BASED_OUTPATIENT_CLINIC_OR_DEPARTMENT_OTHER): Payer: Self-pay

## 2022-03-16 ENCOUNTER — Emergency Department (HOSPITAL_BASED_OUTPATIENT_CLINIC_OR_DEPARTMENT_OTHER)
Admission: EM | Admit: 2022-03-16 | Discharge: 2022-03-16 | Disposition: A | Discharge status: Home / Self Care | Payer: Medicaid Other | Attending: Emergency Medicine | Admitting: Emergency Medicine

## 2022-03-16 DIAGNOSIS — H60501 Unspecified acute noninfective otitis externa, right ear: Secondary | ICD-10-CM | POA: Insufficient documentation

## 2022-03-16 MED ORDER — NEOMYCIN-POLYMYXIN-HC 3.5-10000-1 OT SUSP: 4.0000 [drp] | Freq: Three times a day (TID) | OTIC | 0 refills | Status: AC

## 2022-03-16 NOTE — Discharge Instructions (Addendum)
Your exam today shows that you have an external ear infection.  I have sent eardrops into the pharmacy for you.  If you have any worsening symptoms you can return for evaluation otherwise continue taking the eardrops and take Tylenol and Motrin for pain control. ?

## 2022-03-16 NOTE — ED Provider Notes (Signed)
?[facility] EMERGENCY DEPARTMENT ?Provider Note ? ? ?CSN: [id number] ?Arrival date & time: 03/16/22  1632 ? ?  ? ?History ? ?Chief Complaint  ?Patient presents with  ? Otalgia  ? ? ?Rhilee A Filsinger is a 26 y.o. female. ? ?26 year old female presents today for evaluation of right ear pain of 2-day duration.  Onset yesterday.  She has tried over-the-counter eardrops without improvement.  She denies fever, drainage, sinus congestion, other symptoms.  She denies swimming or taking a recent bath but she does states she takes about 3 showers a day and does not think she does a good job of drying out her ears. ? ?The history is provided by the patient. No language interpreter was used.  ? ?  ? ?Home Medications ?Prior to Admission medications   ?Medication Sig Start Date End Date Taking? Authorizing Provider  ?albuterol (PROVENTIL) (2.5 MG/3ML) 0.083% nebulizer solution Take 3 mLs (2.5 mg total) by nebulization every 6 (six) hours as needed for wheezing or shortness of breath. 08/16/21   Sabas Sous, MD  ?albuterol (VENTOLIN HFA) 108 (90 Base) MCG/ACT inhaler Inhale 2 puffs by mouth into the lungs every 4 (four) hours as needed for wheezing. 08/16/21   Sabas Sous, MD  ?amoxicillin-clavulanate (AUGMENTIN) 875-125 MG tablet Take 1 tablet by mouth every 12 (twelve) hours. 07/23/20   Melene Plan, DO  ?metroNIDAZOLE (FLAGYL) 500 MG tablet Take 1 tablet (500 mg total) by mouth 2 (two) times daily. 06/08/21   Milagros Loll, MD  ?metroNIDAZOLE (FLAGYL) 500 MG tablet Take 1 tablet (500 mg total) by mouth 2 (two) times daily. One po bid x 7 days 10/04/21   Arby Barrette, MD  ?ondansetron (ZOFRAN ODT) 4 MG disintegrating tablet 4mg  ODT q4 hours prn nausea/vomit 07/23/20   09/22/20, DO  ?   ? ?Allergies    ?Azithromycin   ? ?Review of Systems   ?Review of Systems  ?Constitutional:  Negative for chills and fever.  ?HENT:  Positive for ear pain. Negative for ear discharge and sore throat.   ?Respiratory:  Negative for  cough.   ?Neurological:  Negative for headaches.  ?All other systems reviewed and are negative. ? ?Physical Exam ?Updated Vital Signs ?BP 111/72 (BP Location: Left Arm)   Pulse 70   Temp 98.6 ?F (37 ?C) (Oral)   Resp 18   Ht 5\' 2"  (1.575 m)   Wt 71.8 kg   LMP 02/22/2022 (Approximate)   SpO2 100%   BMI 28.95 kg/m?  ?Physical Exam ?Vitals and nursing note reviewed.  ?Constitutional:   ?   General: She is not in acute distress. ?   Appearance: Normal appearance. She is not ill-appearing.  ?HENT:  ?   Head: Normocephalic and atraumatic.  ?   Right Ear: Hearing and tympanic membrane normal.  ?   Left Ear: Hearing, tympanic membrane, ear canal and external ear normal.  ?   Ears:  ?   Comments: Right ear with inflammation noted in the canal consistent with otitis externa. ?   Nose: Nose normal.  ?Eyes:  ?   General: No scleral icterus. ?   Extraocular Movements: Extraocular movements intact.  ?   Conjunctiva/sclera: Conjunctivae normal.  ?Cardiovascular:  ?   Rate and Rhythm: Normal rate and regular rhythm.  ?   Pulses: Normal pulses.  ?   Heart sounds: Normal heart sounds.  ?Pulmonary:  ?   Effort: Pulmonary effort is normal. No respiratory distress.  ?   Breath  sounds: Normal breath sounds. No wheezing or rales.  ?Abdominal:  ?   General: There is no distension.  ?   Tenderness: There is no abdominal tenderness.  ?Musculoskeletal:     ?   General: Normal range of motion.  ?   Cervical back: Normal range of motion.  ?Skin: ?   General: Skin is warm and dry.  ?Neurological:  ?   General: No focal deficit present.  ?   Mental Status: She is alert. Mental status is at baseline.  ? ? ?ED Results / Procedures / Treatments   ?Labs ?(all labs ordered are listed, but only abnormal results are displayed) ?Labs Reviewed - No data to display ? ?EKG ?None ? ?Radiology ?No results found. ? ?Procedures ?Procedures  ? ? ?Medications Ordered in ED ?Medications - No data to display ? ?ED Course/ Medical Decision Making/ A&P ?  ?                         ?Medical Decision Making ? ?26 year old female presents today for evaluation of right ear pain of 2-day duration.  She has tried over-the-counter eardrops without improvement.  Exam consistent with otitis externa.  Will prescribe Cortisporin eardrops.  Return precautions discussed.  Patient voices understanding and is in agreement with plan. ? ?Final Clinical Impression(s) / ED Diagnoses ?Final diagnoses:  ?Acute otitis externa of right ear, unspecified type  ? ? ?Rx / DC Orders ?ED Discharge Orders   ? ?      Ordered  ?  neomycin-polymyxin-hydrocortisone (CORTISPORIN) 3.5-10000-1 OTIC suspension  3 times daily       ? 03/16/22 1719  ? ?  ?  ? ?  ? ? ?  ?Marita [place], PA-C ?03/16/22 1719 ? ?  ?Tegeler, Canary Brim, MD ?03/16/22 2018 ? ?

## 2022-03-16 NOTE — ED Triage Notes (Signed)
Pt reports right ear pain x 1 day.  

## 2022-07-23 ENCOUNTER — Other Ambulatory Visit (HOSPITAL_BASED_OUTPATIENT_CLINIC_OR_DEPARTMENT_OTHER): Payer: Self-pay

## 2022-07-30 ENCOUNTER — Other Ambulatory Visit (HOSPITAL_BASED_OUTPATIENT_CLINIC_OR_DEPARTMENT_OTHER): Payer: Self-pay

## 2022-08-03 ENCOUNTER — Other Ambulatory Visit (HOSPITAL_BASED_OUTPATIENT_CLINIC_OR_DEPARTMENT_OTHER): Payer: Self-pay

## 2022-09-30 ENCOUNTER — Other Ambulatory Visit: Payer: Self-pay
